# Patient Record
Sex: Female | Born: 1970 | Race: White | Hispanic: No | Marital: Married | Smoking: Never smoker
Health system: Southern US, Community
[De-identification: ages and names within clinical notes are randomized; demographics above are authoritative.]

## PROBLEM LIST (undated history)

## (undated) DIAGNOSIS — K5792 Diverticulitis of intestine, part unspecified, without perforation or abscess without bleeding: Secondary | ICD-10-CM

## (undated) DIAGNOSIS — R971 Elevated cancer antigen 125 [CA 125]: Secondary | ICD-10-CM

## (undated) DIAGNOSIS — Z9889 Other specified postprocedural states: Secondary | ICD-10-CM

## (undated) DIAGNOSIS — F419 Anxiety disorder, unspecified: Secondary | ICD-10-CM

## (undated) DIAGNOSIS — N838 Other noninflammatory disorders of ovary, fallopian tube and broad ligament: Secondary | ICD-10-CM

## (undated) DIAGNOSIS — F32A Depression, unspecified: Secondary | ICD-10-CM

## (undated) DIAGNOSIS — G43909 Migraine, unspecified, not intractable, without status migrainosus: Secondary | ICD-10-CM

## (undated) DIAGNOSIS — R112 Nausea with vomiting, unspecified: Secondary | ICD-10-CM

## (undated) HISTORY — DX: Elevated cancer antigen 125 (CA 125): R97.1

## (undated) HISTORY — PX: OTHER SURGICAL HISTORY: SHX169

## (undated) HISTORY — PX: ABLATION: SHX5711

## (undated) HISTORY — PX: BUNIONECTOMY: SHX129

## (undated) HISTORY — DX: Other noninflammatory disorders of ovary, fallopian tube and broad ligament: N83.8

---

## 2003-10-09 ENCOUNTER — Ambulatory Visit: Payer: Self-pay | Admitting: Psychiatry

## 2003-10-09 ENCOUNTER — Other Ambulatory Visit (HOSPITAL_COMMUNITY): Admission: RE | Admit: 2003-10-09 | Discharge: 2003-10-23 | Payer: Self-pay | Admitting: Psychiatry

## 2003-10-27 ENCOUNTER — Ambulatory Visit (HOSPITAL_COMMUNITY): Payer: Self-pay | Admitting: Professional Counselor

## 2003-11-04 ENCOUNTER — Ambulatory Visit (HOSPITAL_COMMUNITY): Payer: Self-pay | Admitting: Psychiatry

## 2003-11-05 ENCOUNTER — Ambulatory Visit (HOSPITAL_COMMUNITY): Payer: Self-pay | Admitting: Professional Counselor

## 2003-11-06 ENCOUNTER — Other Ambulatory Visit (HOSPITAL_COMMUNITY): Admission: RE | Admit: 2003-11-06 | Discharge: 2003-11-12 | Payer: Self-pay | Admitting: Psychiatry

## 2003-11-06 ENCOUNTER — Ambulatory Visit: Payer: Self-pay | Admitting: Psychiatry

## 2003-11-13 ENCOUNTER — Ambulatory Visit (HOSPITAL_COMMUNITY): Payer: Self-pay | Admitting: Professional Counselor

## 2003-11-17 ENCOUNTER — Ambulatory Visit (HOSPITAL_COMMUNITY): Payer: Self-pay | Admitting: Professional Counselor

## 2003-11-19 ENCOUNTER — Inpatient Hospital Stay (HOSPITAL_COMMUNITY): Admission: RE | Admit: 2003-11-19 | Discharge: 2003-11-25 | Payer: Self-pay | Admitting: Psychiatry

## 2003-11-20 ENCOUNTER — Ambulatory Visit (HOSPITAL_COMMUNITY): Admission: RE | Admit: 2003-11-20 | Discharge: 2003-11-20 | Payer: Self-pay | Admitting: Psychiatry

## 2003-12-02 ENCOUNTER — Ambulatory Visit (HOSPITAL_COMMUNITY): Payer: Self-pay | Admitting: Professional Counselor

## 2003-12-11 ENCOUNTER — Ambulatory Visit (HOSPITAL_COMMUNITY): Payer: Self-pay | Admitting: Psychiatry

## 2007-08-26 ENCOUNTER — Encounter: Admission: RE | Admit: 2007-08-26 | Discharge: 2007-08-26 | Payer: Self-pay | Admitting: Family Medicine

## 2010-07-08 NOTE — Discharge Summary (Signed)
NAMEMAHAGONY, Heather Stephens NO.:  [id number]   MEDICAL RECORD NO.:  [id number]          PATIENT TYPE:  IPS   LOCATION:  0307                          FACILITY:  [facility]   PHYSICIAN:  Geoffery Lyons, M.D.      DATE OF BIRTH:  07/11/1970   DATE OF ADMISSION:  11/19/2003  DATE OF DISCHARGE:  11/25/2003                                 DISCHARGE SUMMARY   CHIEF COMPLAINT AND PRESENT ILLNESS:  This was the first admission to [facility] for this 40 year old white female, married,  voluntarily admitted.  Reports of depression since childhood.  Family  history of depression and suicide.  Admitted feeling totally out of control;  increased spending; compulsive eating; gained 40 pounds; more than three or  four weeks of decreased sleep; racing thoughts; auditory hallucinations;  hearing voices around the house and bloody dreams; feeling unsafe.   PAST PSYCHIATRIC HISTORY:  First time inpatient.  Has seen Dr. Ladona Ridgel and  Dr. Lolly Mustache.  History of depression and mood swings.   ALCOHOL/DRUG HISTORY:  Denies the use or abuse of any substances.   PAST MEDICAL HISTORY:  1.  Migraine headaches.  2.  Restless leg.   MEDICATIONS:  1.  Atenolol 25 mg daily.  2.  Topamax.  3.  Xanax 0.25 mg half to one twice a day as needed.  4.  Abilify 10 mg daily.  5.  Ambien 10 at bedtime for sleep.  6.  Requip 1 mg at night.  7.  Wellbutrin XL 300 mg.  8.  Prozac 20 mg per day, decreased from 60.   PHYSICAL EXAMINATION:  Performed and failed to show any acute findings.   LABORATORY WORKUP:  CBC within normal limits.  Blood chemistries within  normal limits.  Liver profile within normal limits.  TSH 0.984.  Drug  screening negative for substances of abuse.   MENTAL STATUS EXAM:  Reveals a fully alert, calm, focused female.  Cooperative.  Mood anxious, depressed.  Affect anxious.  Speech normal rate,  tempo and production.  Thought processes logical, coherent and relevant.  Some  racing thoughts.  Positive for suicide idea.  No homicide ideas.  No  hallucinations.  Cognition well-preserved.   ADMISSION DIAGNOSES:   AXIS I:  Rule out bipolar disorder, hypomanic.   AXIS II:  No diagnosis.   AXIS III:  Migraine headaches.   AXIS IV:  Moderate.   AXIS V:  Global Assessment of Functioning upon admission 29; highest Global  Assessment of Functioning in the last year 65 to 70.   COURSE IN HOSPITAL:  She was admitted and started in individual and group  psychotherapy.  She was maintained on Prozac 20, Xanax 0.25 half to one  twice a day as needed, Wellbutrin XL 300 mg daily and Ambien 10 at bedtime  for sleep.  Abilify 10 mg daily, atenolol 25 every day.  Abilify was  increased to 15.  She was placed on Topamax 25 twice a day.  Prozac had been  discontinued.  She was started on Lexapro 10 mg per day and Topamax was  placed at 50  twice a day.  She endorsed a long history of depression, but  also of mood swings, racing thoughts, compulsive overspending, overeating,  binging with a question of purging and overspending.  Endorsed that her  behaviors have been present for a long time.  Was on Prozac up to 60.  Has  also been on Zoloft and Paxil unsuccessfully.  That is when she was switched  from Prozac to Lexapro.  She started to tolerate the medication well,  feeling very depressed and unsafe.  Endorsed that she could end up hurting  herself.  Mood was depressed and anxious, worried.  Affect was anxious,  ruminating.  By October the 3rd, she was endorsing voices.  Apparently it  was of her grandmother as well as a group of voices.  Could not tell what  they were saying.  Endorsed that she had become more anxious when hearing  the voices and scared.  There was a session scheduled with her husband.  Abilify was increased to 20 and she had been placed on Seroquel 100 at  bedtime for sleep.  On October the 4th, continued to endorse auditory  hallucinations, more so  __________ in nature.  Endorsed nightmares reacting  to the family session with the husband that she was going to have.  Patient  feeling unsafe outside of the hospital and some passive SI, but willing to  contract for safety.  On October the 4th there was a session with her  husband.  The feeling was that she was not baseline.  She was still  reporting auditory and visual hallucinations, hearing recently deceased  grandmother saying [place] Faster.  On October the 5th she was feeling better.  She was in full contact with reality.  Was endorsing no suicide, no homicide  ideations.  Endorsed decreasing hallucinations.  There was no evidence of  delusions.  Feeling that the session with the husband went well.  He was  supportive, agreeable to family counseling.  She feels stable enough to be  safe.  She does have control.  It was felt that she had obtained full  benefit from what the hospital had to offer, that she was feeling safe and  she was willing to pursue outpatient treatment.  We went ahead and  discharged to outpatient follow-up.   DISCHARGE DIAGNOSES:   AXIS I:  Bipolar disorder with psychotic features.   AXIS II:  No diagnosis.   AXIS III:  Migraine headaches.   AXIS IV:  Moderate.   AXIS V:  Global Assessment of Functioning upon discharge was 55.   DISCHARGE MEDICATIONS:  1.  Wellbutrin XL 300 one in the morning.  2.  Ambien 10 at bedtime as needed for sleep.  3.  Requip 0.5 at night.  4.  Lexapro 10 mg daily.  5.  Topamax 50 twice a day.  6.  Abilify 20 mg daily.  7.  Seroquel 100 at night.  8.  Xanax 0.25 one every eight hours as needed for anxiety.  9.  Seroquel 25 two every six hours as needed for agitation.   FOLLOW UP:  Intensive outpatient program with Dr. Lolly Mustache.     Farrel Gordon   [place]/MEDQ  D:  12/22/2003  T:  12/23/2003  Job:  [id number]

## 2011-08-24 ENCOUNTER — Encounter (HOSPITAL_BASED_OUTPATIENT_CLINIC_OR_DEPARTMENT_OTHER): Payer: Self-pay | Admitting: *Deleted

## 2011-08-24 ENCOUNTER — Emergency Department (HOSPITAL_BASED_OUTPATIENT_CLINIC_OR_DEPARTMENT_OTHER)
Admission: EM | Admit: 2011-08-24 | Discharge: 2011-08-24 | Disposition: A | Discharge status: Home / Self Care | Payer: BC Managed Care – PPO | Attending: Emergency Medicine | Admitting: Emergency Medicine

## 2011-08-24 DIAGNOSIS — R51 Headache: Secondary | ICD-10-CM

## 2011-08-24 HISTORY — DX: Migraine, unspecified, not intractable, without status migrainosus: G43.909

## 2011-08-24 MED ORDER — DIPHENHYDRAMINE HCL 50 MG/ML IJ SOLN
25.0000 mg | Freq: Once | INTRAMUSCULAR | Status: DC
  Filled 2011-08-24: qty 1

## 2011-08-24 MED ORDER — METOCLOPRAMIDE HCL 5 MG/ML IJ SOLN
10.0000 mg | Freq: Once | INTRAMUSCULAR | Status: AC
  Administered 2011-08-24: 10 mg via INTRAMUSCULAR
  Filled 2011-08-24: qty 2

## 2011-08-24 MED ORDER — KETOROLAC TROMETHAMINE 60 MG/2ML IM SOLN
60.0000 mg | Freq: Once | INTRAMUSCULAR | Status: AC
  Administered 2011-08-24: 60 mg via INTRAMUSCULAR
  Filled 2011-08-24: qty 2

## 2011-08-24 MED ORDER — DIPHENHYDRAMINE HCL 50 MG/ML IJ SOLN
25.0000 mg | Freq: Once | INTRAMUSCULAR | Status: AC
  Administered 2011-08-24: 25 mg via INTRAMUSCULAR

## 2011-08-24 NOTE — ED Notes (Signed)
Migraine headache since this afternoon. No relief with Phenergan. Light and sound sensitive.

## 2011-08-24 NOTE — ED Notes (Signed)
Pt. Noted with clear speech and no neuro deficits.  Pt. Reports the migraine is on the L side of her head.

## 2011-08-24 NOTE — ED Provider Notes (Signed)
Medical screening examination/treatment/procedure(s) were performed by non-physician practitioner and as supervising physician I was immediately available for consultation/collaboration.   Forbes Cellar, MD 08/24/11 2351

## 2011-08-24 NOTE — ED Provider Notes (Signed)
History     CSN: [id number]  Arrival date & time 08/24/11  2034   First MD Initiated Contact with Patient 08/24/11 2111      Chief Complaint  Patient presents with  . Migraine    (Consider location/radiation/quality/duration/timing/severity/associated sxs/prior treatment) Patient is a 41 y.o. female presenting with migraine. The history is provided by the patient. No language interpreter was used.  Migraine This is a new problem. The current episode started today. The problem occurs constantly. The problem has been gradually worsening. Associated symptoms include headaches. Exacerbated by: light and sound. Treatments tried: phenergan. The treatment provided no relief.  Pt has a history of migranes.  Pt reports she has not had a shot for migranes in a long time.    Past Medical History  Diagnosis Date  . Migraine headache     History reviewed. No pertinent past surgical history.  No family history on file.  History  Substance Use Topics  . Smoking status: Never Smoker   . Smokeless tobacco: Not on file  . Alcohol Use: No    OB History    Grav Para Term Preterm Abortions TAB SAB Ect Mult Living                  Review of Systems  Neurological: Positive for headaches.  All other systems reviewed and are negative.    Allergies  Review of patient's allergies indicates not on file.  Home Medications  No current outpatient prescriptions on file.  BP 124/88  Pulse 72  Temp 98 F (36.7 C) (Oral)  Resp 16  SpO2 100%  Physical Exam  Nursing note and vitals reviewed. Constitutional: She is oriented to person, place, and time. She appears well-developed.  HENT:  Head: Normocephalic and atraumatic.  Right Ear: External ear normal.  Nose: Nose normal.  Mouth/Throat: Oropharynx is clear and moist.  Eyes: Conjunctivae and EOM are normal. Pupils are equal, round, and reactive to light.  Neck: Normal range of motion. Neck supple.  Cardiovascular: Normal rate and  normal heart sounds.   Pulmonary/Chest: Effort normal and breath sounds normal.  Abdominal: Soft.  Musculoskeletal: Normal range of motion.  Neurological: She is alert and oriented to person, place, and time. She has normal reflexes. No cranial nerve deficit. Coordination normal.  Skin: Skin is warm.  Psychiatric: She has a normal mood and affect.    ED Course  Procedures (including critical care time)  Labs Reviewed - No data to display No results found.   1. Headache       MDM  Pt given torodol, benadryl and reglan.   Pt advised to see her Md for recheck tomorrow if symptoms persist.        Elson Areas, [place] 08/24/11 2125  Lonia Skinner [place], [place] 08/24/11 2126

## 2019-10-13 ENCOUNTER — Emergency Department (HOSPITAL_BASED_OUTPATIENT_CLINIC_OR_DEPARTMENT_OTHER): Payer: Self-pay

## 2019-10-13 ENCOUNTER — Encounter (HOSPITAL_BASED_OUTPATIENT_CLINIC_OR_DEPARTMENT_OTHER): Payer: Self-pay | Admitting: *Deleted

## 2019-10-13 ENCOUNTER — Emergency Department (HOSPITAL_BASED_OUTPATIENT_CLINIC_OR_DEPARTMENT_OTHER)
Admission: EM | Admit: 2019-10-13 | Discharge: 2019-10-13 | Disposition: A | Discharge status: Home / Self Care | Payer: Self-pay | Attending: Emergency Medicine | Admitting: Emergency Medicine

## 2019-10-13 ENCOUNTER — Other Ambulatory Visit: Payer: Self-pay

## 2019-10-13 DIAGNOSIS — R1900 Intra-abdominal and pelvic swelling, mass and lump, unspecified site: Secondary | ICD-10-CM

## 2019-10-13 DIAGNOSIS — Z20822 Contact with and (suspected) exposure to covid-19: Secondary | ICD-10-CM | POA: Insufficient documentation

## 2019-10-13 DIAGNOSIS — R10814 Left lower quadrant abdominal tenderness: Secondary | ICD-10-CM | POA: Insufficient documentation

## 2019-10-13 DIAGNOSIS — K5732 Diverticulitis of large intestine without perforation or abscess without bleeding: Secondary | ICD-10-CM | POA: Insufficient documentation

## 2019-10-13 DIAGNOSIS — R10813 Right lower quadrant abdominal tenderness: Secondary | ICD-10-CM | POA: Insufficient documentation

## 2019-10-13 DIAGNOSIS — R1909 Other intra-abdominal and pelvic swelling, mass and lump: Secondary | ICD-10-CM | POA: Insufficient documentation

## 2019-10-13 DIAGNOSIS — K5792 Diverticulitis of intestine, part unspecified, without perforation or abscess without bleeding: Secondary | ICD-10-CM

## 2019-10-13 LAB — COMPREHENSIVE METABOLIC PANEL
ALT: 62 U/L — ABNORMAL HIGH (ref 0–44)
AST: 38 U/L (ref 15–41)
Albumin: 4 g/dL (ref 3.5–5.0)
Alkaline Phosphatase: 112 U/L (ref 38–126)
Anion gap: 11 (ref 5–15)
BUN: 15 mg/dL (ref 6–20)
CO2: 21 mmol/L — ABNORMAL LOW (ref 22–32)
Calcium: 8.9 mg/dL (ref 8.9–10.3)
Chloride: 104 mmol/L (ref 98–111)
Creatinine, Ser: 0.8 mg/dL (ref 0.44–1.00)
GFR calc Af Amer: >60 mL/min (ref >60)
GFR calc non Af Amer: >60 mL/min (ref >60)
Glucose, Bld: 108 mg/dL — ABNORMAL HIGH (ref 70–99)
Potassium: 3.5 mmol/L (ref 3.5–5.1)
Sodium: 136 mmol/L (ref 135–145)
Total Bilirubin: 1.3 mg/dL — ABNORMAL HIGH (ref 0.3–1.2)
Total Protein: 7.4 g/dL (ref 6.5–8.1)

## 2019-10-13 LAB — CBC
HCT: 39.3 % (ref 36.0–46.0)
Hemoglobin: 13.2 g/dL (ref 12.0–15.0)
MCH: 31.1 pg (ref 26.0–34.0)
MCHC: 33.6 g/dL (ref 30.0–36.0)
MCV: 92.7 fL (ref 80.0–100.0)
Platelets: 295 10*3/uL (ref 150–400)
RBC: 4.24 MIL/uL (ref 3.87–5.11)
RDW: 11.2 % — ABNORMAL LOW (ref 11.5–15.5)
WBC: 12.7 10*3/uL — ABNORMAL HIGH (ref 4.0–10.5)
nRBC: 0 % (ref 0.0–0.2)

## 2019-10-13 LAB — SARS CORONAVIRUS 2 BY RT PCR (HOSPITAL ORDER, PERFORMED IN ~~LOC~~ HOSPITAL LAB): SARS Coronavirus 2: NEGATIVE

## 2019-10-13 LAB — PREGNANCY, URINE: Preg Test, Ur: NEGATIVE

## 2019-10-13 LAB — LIPASE, BLOOD: Lipase: 21 U/L (ref 11–51)

## 2019-10-13 MED ORDER — KETOROLAC TROMETHAMINE 30 MG/ML IJ SOLN
30.0000 mg | Freq: Once | INTRAMUSCULAR | Status: AC
  Administered 2019-10-13: 30 mg via INTRAVENOUS
  Filled 2019-10-13: qty 1

## 2019-10-13 MED ORDER — ONDANSETRON 4 MG PO TBDP: 4.0000 mg | ORAL_TABLET | Freq: Three times a day (TID) | ORAL | 0 refills | Status: DC | PRN

## 2019-10-13 MED ORDER — METRONIDAZOLE 500 MG PO TABS: 500.0000 mg | ORAL_TABLET | Freq: Three times a day (TID) | ORAL | 0 refills | Status: DC

## 2019-10-13 MED ORDER — SODIUM CHLORIDE 0.9 % IV BOLUS
1000.0000 mL | Freq: Once | INTRAVENOUS | Status: AC
  Administered 2019-10-13: 1000 mL via INTRAVENOUS

## 2019-10-13 MED ORDER — ONDANSETRON HCL 4 MG/2ML IJ SOLN
4.0000 mg | Freq: Once | INTRAMUSCULAR | Status: AC
  Administered 2019-10-13: 4 mg via INTRAVENOUS
  Filled 2019-10-13: qty 2

## 2019-10-13 MED ORDER — FENTANYL CITRATE (PF) 100 MCG/2ML IJ SOLN
100.0000 ug | Freq: Once | INTRAMUSCULAR | Status: AC
  Administered 2019-10-13: 100 ug via INTRAVENOUS
  Filled 2019-10-13: qty 2

## 2019-10-13 MED ORDER — DICYCLOMINE HCL 20 MG PO TABS: 20.0000 mg | ORAL_TABLET | Freq: Two times a day (BID) | ORAL | 0 refills | Status: DC | PRN

## 2019-10-13 MED ORDER — CIPROFLOXACIN HCL 500 MG PO TABS: 500.0000 mg | ORAL_TABLET | Freq: Two times a day (BID) | ORAL | 0 refills | Status: DC

## 2019-10-13 MED ORDER — IOHEXOL 300 MG/ML  SOLN
100.0000 mL | Freq: Once | INTRAMUSCULAR | Status: AC | PRN
  Administered 2019-10-13: 100 mL via INTRAVENOUS

## 2019-10-13 MED ORDER — HYDROMORPHONE HCL 1 MG/ML IJ SOLN
0.5000 mg | Freq: Once | INTRAMUSCULAR | Status: AC
  Administered 2019-10-13: 0.5 mg via INTRAVENOUS
  Filled 2019-10-13: qty 1

## 2019-10-13 MED FILL — ONDANSETRON ODT 4 MG TABLET: 4 | 6 days supply | Qty: 20 | Fill #0

## 2019-10-13 MED FILL — METRONIDAZOLE 500 MG TABS: 500 | 10 days supply | Qty: 30 | Fill #0

## 2019-10-13 MED FILL — DICYCLOMINE 20 MG TABLET: 20 | 10 days supply | Qty: 20 | Fill #0

## 2019-10-13 MED FILL — CIPROFLOXACIN HCL 500 MG TA: 500 | 10 days supply | Qty: 20 | Fill #0

## 2019-10-13 NOTE — ED Provider Notes (Signed)
Heather Stephens   CSN: [id number] Arrival date & time: 10/13/19  1056     History Chief Complaint  Patient presents with  . Abdominal Pain    Heather Stephens is a 49 y.o. female.  HPI      49 year old female presents with concern for lower abdominal pain.  Reports that the pain began yesterday, describes as sharp, located bilaterally in her low pelvis.  The pain is worsening today.  Nothing seems to make it better or worse.  Reports associated nausea without vomiting. Reports she had a low-grade fever last night.  No diarrhea. Pain worse with movement. Severe pain. Was seen at [facility] and had urine without infection and instructed to come here for CT. Has hx of uterine ablation, had small amount of bright vaginal bleeding with menses this time-reports usually small amount of brown.  No other changes. Does not have OBGYN  Past Medical History:  Diagnosis Date  . Migraine headache     There are no problems to display for this patient.   Past Surgical History:  Procedure Laterality Date  . ABLATION       OB History   No obstetric history on file.     No family history on file.  Social History   Tobacco Use  . Smoking status: Never Smoker  . Smokeless tobacco: Never Used  Substance Use Topics  . Alcohol use: No  . Drug use: No    Home Medications Prior to Admission medications   Medication Sig Start Date End Date Taking? Authorizing Provider  ciprofloxacin (CIPRO) 500 MG tablet Take 1 tablet (500 mg total) by mouth 2 (two) times daily for 10 days. 10/13/19 10/23/19  Gareth Morgan, MD  dicyclomine (BENTYL) 20 MG tablet Take 1 tablet (20 mg total) by mouth 2 (two) times daily as needed for spasms (abdominal pain). 10/13/19   Gareth Morgan, MD  metroNIDAZOLE (FLAGYL) 500 MG tablet Take 1 tablet (500 mg total) by mouth 3 (three) times daily for 10 days. 10/13/19 10/23/19  Gareth Morgan, MD  ondansetron (ZOFRAN ODT) 4 MG  disintegrating tablet Take 1 tablet (4 mg total) by mouth every 8 (eight) hours as needed for nausea or vomiting. 10/13/19   Gareth Morgan, MD    Allergies    Sumatriptan and Codeine  Review of Systems   Review of Systems  Constitutional: Negative for fever.  HENT: Negative for sore throat.   Eyes: Negative for visual disturbance.  Respiratory: Negative for cough and shortness of breath.   Cardiovascular: Negative for chest pain.  Gastrointestinal: Negative for abdominal pain and nausea.  Genitourinary: Positive for vaginal bleeding (did have small amount of bleeding, hx of uterine ablation, typical menses is small amount of brown discharge, has had small amt of right ). Negative for difficulty urinating, dysuria and vaginal discharge.  Musculoskeletal: Negative for back pain and neck pain.  Skin: Negative for rash.  Neurological: Negative for syncope and headaches.    Physical Exam Updated Vital Signs BP 109/69   Pulse 84   Temp 98.6 F (37 C) (Oral)   Resp 18   Ht 5\' 4"  (1.626 m)   Wt 83 kg   SpO2 98%   BMI 31.41 kg/m   Physical Exam Vitals and nursing Stephens reviewed.  Constitutional:      General: She is not in acute distress.    Appearance: She is well-developed. She is not diaphoretic.  HENT:     Head: Normocephalic and  atraumatic.  Eyes:     Conjunctiva/sclera: Conjunctivae normal.  Cardiovascular:     Rate and Rhythm: Normal rate and regular rhythm.     Heart sounds: Normal heart sounds. No murmur heard.  No friction rub. No gallop.   Pulmonary:     Effort: Pulmonary effort is normal. No respiratory distress.     Breath sounds: Normal breath sounds. No wheezing or rales.  Abdominal:     General: There is no distension.     Palpations: Abdomen is soft.     Tenderness: There is abdominal tenderness in the right lower quadrant, suprapubic area and left lower quadrant. There is guarding.  Musculoskeletal:        General: No tenderness.     Cervical back:  Normal range of motion.  Skin:    General: Skin is warm and dry.     Findings: No erythema or rash.  Neurological:     Mental Status: She is alert and oriented to person, place, and time.     ED Results / Procedures / Treatments   Labs (all labs ordered are listed, but only abnormal results are displayed) Labs Reviewed  COMPREHENSIVE METABOLIC PANEL - Abnormal; Notable for the following components:      Result Value   CO2 21 (*)    Glucose, Bld 108 (*)    ALT 62 (*)    Total Bilirubin 1.3 (*)    All other components within normal limits  CBC - Abnormal; Notable for the following components:   WBC 12.7 (*)    RDW 11.2 (*)    All other components within normal limits  SARS CORONAVIRUS 2 BY RT PCR (HOSPITAL ORDER, [place] [facility])  LIPASE, BLOOD  PREGNANCY, URINE  CA 125  CEA    EKG None  Radiology CT ABDOMEN PELVIS W CONTRAST  Result Date: 10/13/2019 CLINICAL DATA:  49 year old female with history of right lower quadrant abdominal pain. Suspected acute appendicitis. EXAM: CT ABDOMEN AND PELVIS WITH CONTRAST TECHNIQUE: Multidetector CT imaging of the abdomen and pelvis was performed using the standard protocol following bolus administration of intravenous contrast. CONTRAST:  116mL OMNIPAQUE IOHEXOL 300 MG/ML  SOLN COMPARISON:  No priors. FINDINGS: Lower chest: Unremarkable. Hepatobiliary: Subcentimeter low-attenuation lesion in segment 3 of the liver (axial image 14 of series 2), too small to characterize, but statistically likely to represent a tiny cyst. No other suspicious cystic or solid hepatic lesions. No intra or extrahepatic biliary ductal dilatation. Gallbladder is normal in appearance. Pancreas: No pancreatic mass. No pancreatic ductal dilatation. No pancreatic or peripancreatic fluid collections or inflammatory changes. Spleen: Unremarkable. Adrenals/Urinary Tract: Bilateral kidneys and bilateral adrenal glands are normal in appearance. No  hydroureteronephrosis. Urinary bladder is normal in appearance. Stomach/Bowel: Normal appearance of the stomach. No pathologic dilatation of small bowel or colon. Numerous colonic diverticulae are noted, particularly in the descending colon and sigmoid colon. Inflammatory changes are noted adjacent to the distal descending colon (axial image 49 of series 2), compatible with an acute diverticulitis. No diverticular abscess or signs of frank perforation are noted at this time. Normal appendix. Vascular/Lymphatic: No atherosclerotic calcifications in the abdominal aorta or pelvic vasculature. No lymphadenopathy noted in the abdomen or pelvis. 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 Uterus and right ovary are unremarkable in appearance. Other: Small volume of ascites.  No pneumoperitoneum. Musculoskeletal: There are no aggressive appearing lytic or blastic lesions noted in the visualized portions of the skeleton. IMPRESSION: 1. Acute diverticulitis of the distal descending colon. No diverticular abscess or signs of frank perforation are noted at this time. 2. 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 Further evaluation with pelvic ultrasound is strongly recommended at this time to better evaluate this finding. Additionally, appropriate follow-up with OB-GYN is strongly recommended in the near future. 3. Subcentimeter low-attenuation lesion in segment 3 of the liver, too small to characterize, but statistically likely to represent a cyst. Close attention on follow-up studies is recommended to ensure the stability of this  finding given the large pelvic mass. Electronically Signed   By: Vinnie Langton M.D.   On: 10/13/2019 14:24    Procedures Procedures (including critical care time)  Medications Ordered in ED Medications  ketorolac (TORADOL) 30 MG/ML injection 30 mg (has no administration in time range)  sodium chloride 0.9 % bolus 1,000 mL ( Intravenous Stopped 10/13/19 1519)  ondansetron (ZOFRAN) injection 4 mg (4 mg Intravenous Given 10/13/19 1346)  fentaNYL (SUBLIMAZE) injection 100 mcg (100 mcg Intravenous Given 10/13/19 1349)  iohexol (OMNIPAQUE) 300 MG/ML solution 100 mL (100 mLs Intravenous Contrast Given 10/13/19 1357)    ED Course  I have reviewed the triage vital signs and the nursing notes.  Pertinent labs & imaging results that were available during my care of the patient were reviewed by me and considered in my medical decision making (see chart for details).    MDM Rules/Calculators/A&P                           49 year old female presents with concern for lower abdominal pain. DDx includes appendicitis, pancreatitis, cholecystitis, pyelonephritis, nephrolithiasis, diverticulitis, PID, ovarian torsion, ectopic pregnancy, and tuboovarian abscess.  Pregnancy test negative.  No sign of urinary tract infection.   CT abdomen pelvis shows acute diverticulitis of the distal descending colon without diverticular abscess or signs of perforation.  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  For the diverticulitis, she is stable for outpatient management.  Given prescription for Cipro, Flagyl, and Zofran.  She declines narcotic pain medications for pain, was given prescription for Bentyl.  For CT finding of cystic ovarian mass-discussed with OB/GYN Dr. Elgie Congo, added on a CEA 125, CEA, and pelvic ultrasound.  [place] pending at time of transfer of care. She is to  follow-up with med [place].    Final Clinical Impression(s) / ED Diagnoses Final diagnoses:  Pelvic mass  Diverticulitis    Rx / DC Orders ED Discharge Orders         Ordered    ciprofloxacin (CIPRO) 500 MG tablet  2 times daily        10/13/19 1520    metroNIDAZOLE (FLAGYL) 500 MG tablet  3 times daily        10/13/19 1520    dicyclomine (BENTYL) 20 MG tablet  2 times daily PRN        10/13/19 1520    ondansetron (ZOFRAN ODT) 4 MG disintegrating tablet  Every 8 hours PRN  10/13/19 [place], MD 10/13/19 2226

## 2019-10-13 NOTE — ED Notes (Signed)
Pt. Reports abd. Pain started last night getting worse this morning.  Pt. Reports no vomiting and no diarrhea.  Pt. Reports just severe pain in lower abd. Started on the R side of Abd. And now from R to left with any movement the pain is awful.

## 2019-10-13 NOTE — ED Notes (Signed)
Unable to get blood from L a/c. Pt stated she had nothing to eat or drink today and feels queasy. Wants to wait for a 2nd stick at present

## 2019-10-13 NOTE — ED Notes (Signed)
Pt asks for water, informed her no food or drink until she see the provider, asks triage RN also

## 2019-10-13 NOTE — Discharge Instructions (Addendum)
For your diverticulitis, please take the antibiotics as prescribed.  Please follow-up with your primary doctor for recheck later this week.  If you develop fever, worsening pain, vomiting, other new concerning symptom, please return to the ER for reassessment.  Regarding the pelvic mass, please call the [facility] in [facility] to get a close follow-up appointment with a gynecologist.  You will need additional testing.  If you have any issues getting an appointment with their clinic, I would recommend calling Dr. Charisse March office, she is a gynecologic oncologist in [place].

## 2019-10-13 NOTE — ED Triage Notes (Signed)
Mid lower abdominal pain since yesterday. Nausea. Fever. She was seen at [facility] and she does not have a UTI. She was told to come here for a CT scan.

## 2019-10-14 ENCOUNTER — Telehealth: Payer: Self-pay | Admitting: *Deleted

## 2019-10-14 LAB — CEA: CEA: 1.4 ng/mL (ref 0.0–4.7)

## 2019-10-14 LAB — CA 125: Cancer Antigen (CA) 125: 50 U/mL — ABNORMAL HIGH (ref 0.0–38.1)

## 2019-10-14 NOTE — Telephone Encounter (Signed)
Called and spoke with the patient regarding her referral from the ER. Offered the patient a new patient appt for tomorrow, declined appt due to the pain from her diverticulitis. Appt scheduled for 8/30 with Dr Denman George at 11:15 am. Hanley Seamen the address and phone number for the clinic. Also gave the patient the policy for visitors, mask and parking

## 2019-10-15 ENCOUNTER — Other Ambulatory Visit: Payer: Self-pay

## 2019-10-15 ENCOUNTER — Emergency Department (HOSPITAL_BASED_OUTPATIENT_CLINIC_OR_DEPARTMENT_OTHER)
Admission: EM | Admit: 2019-10-15 | Discharge: 2019-10-15 | Disposition: A | Discharge status: Home / Self Care | Payer: Self-pay | Attending: Emergency Medicine | Admitting: Emergency Medicine

## 2019-10-15 ENCOUNTER — Encounter (HOSPITAL_BASED_OUTPATIENT_CLINIC_OR_DEPARTMENT_OTHER): Payer: Self-pay | Admitting: Emergency Medicine

## 2019-10-15 DIAGNOSIS — Z5321 Procedure and treatment not carried out due to patient leaving prior to being seen by health care provider: Secondary | ICD-10-CM | POA: Insufficient documentation

## 2019-10-15 DIAGNOSIS — R1110 Vomiting, unspecified: Secondary | ICD-10-CM | POA: Insufficient documentation

## 2019-10-15 NOTE — ED Triage Notes (Signed)
Pt here with emesis since noon.

## 2019-10-20 ENCOUNTER — Other Ambulatory Visit: Payer: Self-pay

## 2019-10-20 ENCOUNTER — Encounter: Payer: Self-pay | Admitting: Gynecologic Oncology

## 2019-10-20 ENCOUNTER — Inpatient Hospital Stay: Payer: Self-pay | Attending: Gynecologic Oncology | Admitting: Gynecologic Oncology

## 2019-10-20 ENCOUNTER — Other Ambulatory Visit: Payer: Self-pay | Admitting: Gynecologic Oncology

## 2019-10-20 VITALS — BP 105/75 | HR 117 | Temp 99.2°F | Resp 18 | Ht 64.0 in | Wt 178.5 lb

## 2019-10-20 DIAGNOSIS — R1031 Right lower quadrant pain: Secondary | ICD-10-CM | POA: Insufficient documentation

## 2019-10-20 DIAGNOSIS — N920 Excessive and frequent menstruation with regular cycle: Secondary | ICD-10-CM | POA: Insufficient documentation

## 2019-10-20 DIAGNOSIS — N939 Abnormal uterine and vaginal bleeding, unspecified: Secondary | ICD-10-CM

## 2019-10-20 DIAGNOSIS — R1900 Intra-abdominal and pelvic swelling, mass and lump, unspecified site: Secondary | ICD-10-CM

## 2019-10-20 DIAGNOSIS — N898 Other specified noninflammatory disorders of vagina: Secondary | ICD-10-CM | POA: Insufficient documentation

## 2019-10-20 DIAGNOSIS — Z79899 Other long term (current) drug therapy: Secondary | ICD-10-CM | POA: Insufficient documentation

## 2019-10-20 DIAGNOSIS — R971 Elevated cancer antigen 125 [CA 125]: Secondary | ICD-10-CM | POA: Insufficient documentation

## 2019-10-20 NOTE — H&P (View-Only) (Signed)
New Patient Note: Gyn-Onc  Heather Stephens 49 y.o. female  CC:  Chief Complaint  Patient presents with   Pelvic mass    New Patient    Assessment/Plan:  Heather Stephens  is a 4 y.o.  year old with a large pelvic mass, mildly elevated CA 125, possible history of recent diverticulitis, profuse vaginal discharge/cervical discharge, pelvic pain and abnormal uterine bleeding.  I am concerned that she may have a tubal malignancy given the appearance on imaging and her mucoid vaginal discharge. I am recommending robotic assisted total hysterectomy and BSO with staging if malignancy is identified. Alternatively, she may have associated infected ovarian mass from recent diverticulitis.  I explained the risks of the surgery including  bleeding, infection, damage to internal organs (such as bladder,ureters, bowels), blood clot, reoperation and rehospitalization. I explained that minilap is sometimes necessary for specimen delivery. I anticipated a 2-4 weeks postop recovery off of work.   We will follow-up on today's biopsy. If malignant, she will require endometrial staging procedures.   HPI: Heather Stephens is a 81 year old P1 who was seen in consultation at the request of the ER for evaluation of a large pelvic mass, and mildly elevated CA-125.  The patient reported symptoms of profuse mucoid vaginal discharge for several years.  She denied receiving medical care in the preceding 11 years due to lack of health insurance which she attributes to the introduction of the affordable care act.  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  She began experiencing suprapubic and right lower quadrant pain in August 2021 and was seen at [facility] emergency department for this symptom on October 13, 2019.  This prompted a CT of the abdomen and pelvis to be performed which revealed a subcentimeter low-attenuation lesion in  the liver too small to characterize but likely a cyst.  Numerous colonic diverticula noted particular in the descending colon and sigmoid colon with inflammatory changes noted to the adjacent distal descending colon compatible with acute diverticulitis.  No signs of perforation.  No lymphadenopathy.  There is a large heterogeneously enhancing cystic mass in the central pelvis which appeared to emanate from the left adnexa which was presumably a large ovarian mass measured 11.2 x 7.7 x 9.8 cm.  The uterus and right ovary were unremarkable.  There were no ascites or pneumoperitoneum.  A Ca1 25 was drawn on October 13, 2019 it was mildly elevated at 50.  CEA was normal at 1.4.  A transvaginal ultrasound scan was performed to follow-up the CT scan findings, this was performed on the same day.  It showed a large heterogeneous mostly solid-appearing mass within the pelvis measuring up to 12.9 cm.  The mass was in the midline and could not be localized to either ovary due to its size.  Both ovaries were able to be seen as discrete structures with fluid present on the left.  The uterus measured 8.2 x 4.2 x 5.7 cm with a 5 mm endometrium.  She was treated empirically for diverticulitis with ciprofloxacin and Flagyl which was changed to Augmentin when she developed nausea on the Flagyl.  She denied fever and denied an elevated white blood cell count.  She denied symptoms of hematochezia or constipation.  Her medical history is most significant for an absence of health care in the 11 years preceding her consultation with me.  She denied other underlying illness.  She  was not vaccinated against COVID-19 due to concern regarding the safety of the vaccine.  She declined proceeding with vaccination.  Her surgical history is most significant for no prior abdominal surgeries.  Her gynecologic history is remarkable for 1 prior vaginal delivery.  She had no history of abnormal Pap test but her preceding Pap test have been  greater than 10 years prior to her consultation with me.  She denied gynecologic care in the preceding 10 years prior to her first consultation with me.  She reported cyclical brown spotting since undergoing an endometrial ablation in her mid to late 61s for menorrhagia.  Her family cancer history is remarkable for a widely disseminated cancer in her maternal grandfather otherwise no concerning history for ovarian, breast, or endometrial cancers.  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 She lives with her husband.   Current Meds:  Outpatient Encounter Medications as of 10/20/2019  Medication Sig   amoxicillin-clavulanate (AUGMENTIN) 875-125 MG tablet Take 1 tablet by mouth 2 (two) times daily.   promethazine (PHENERGAN) 12.5 MG suppository Place 12.5 mg rectally every 6 (six) hours as needed.   [DISCONTINUED] ciprofloxacin (CIPRO) 500 MG tablet Take 1 tablet (500 mg total) by mouth 2 (two) times daily for 10 days. (Patient not taking: Reported on 10/20/2019)   [DISCONTINUED] dicyclomine (BENTYL) 20 MG tablet Take 1 tablet (20 mg total) by mouth 2 (two) times daily as needed for spasms (abdominal pain). (Patient not taking: Reported on 10/20/2019)   [DISCONTINUED] metroNIDAZOLE (FLAGYL) 500 MG tablet Take 1 tablet (500 mg total) by mouth 3 (three) times daily for 10 days. (Patient not taking: Reported on 10/20/2019)   [DISCONTINUED] ondansetron (ZOFRAN ODT) 4 MG disintegrating tablet Take 1 tablet (4 mg total) by mouth every 8 (eight) hours as needed for nausea or vomiting. (Patient not taking: Reported on 10/20/2019)   No facility-administered encounter medications on file as of 10/20/2019.    Allergy:  Allergies  Allergen Reactions   Sumatriptan Shortness Of Breath   Metronidazole Nausea And Vomiting   Codeine Itching    Social Hx:   Social History   Socioeconomic History   Marital status: Married    Spouse name: Not on file   Number of  children: Not on file   Years of education: Not on file   Highest education level: Not on file  Occupational History   Not on file  Tobacco Use   Smoking status: Never Smoker   Smokeless tobacco: Never Used  Vaping Use   Vaping Use: Never used  Substance and Sexual Activity   Alcohol use: No   Drug use: No   Sexual activity: Not on file  Other Topics Concern   Not on file  Social History Narrative   Not on file   Social Determinants of Health   Financial Resource Strain:    Difficulty of Paying Living Expenses: Not on file  Food Insecurity:    Worried About Running Out of Food in the Last Year: Not on file   Ran Out of Food in the Last Year: Not on file  Transportation Needs:    Lack of Transportation (Medical): Not on file   Lack of Transportation (Non-Medical): Not on file  Physical Activity:    Days of Exercise per Week: Not on file   Minutes of Exercise per Session: Not on file  Stress:    Feeling of Stress : Not on file  Social Connections:  Frequency of Communication with Friends and Family: Not on file   Frequency of Social Gatherings with Friends and Family: Not on file   Attends Religious Services: Not on file   Active Member of Clubs or Organizations: Not on file   Attends Archivist Meetings: Not on file   Marital Status: Not on file  Intimate Partner Violence:    Fear of Current or Ex-Partner: Not on file   Emotionally Abused: Not on file   Physically Abused: Not on file   Sexually Abused: Not on file    Past Surgical Hx:  Past Surgical History:  Procedure Laterality Date   ABLATION     BUNIONECTOMY     right foot    Past Medical Hx:  Past Medical History:  Diagnosis Date   Migraine headache     Past Gynecological History:  Se HPI No LMP recorded. Patient has had an ablation.  Family Hx:  Family History  Problem Relation Age of Onset   Cancer Paternal Grandmother        Cancer all over   Breast cancer Neg Hx    Ovarian  cancer Neg Hx    Endometrial cancer Neg Hx     Review of Systems:  Constitutional  Feels unwell with pain  ENT Normal appearing ears and nares bilaterally Skin/Breast  No rash, sores, jaundice, itching, dryness Cardiovascular  No chest pain, shortness of breath, or edema  Pulmonary  No cough or wheeze.  Gastro Intestinal  No nausea, vomitting, or diarrhoea. No bright red blood per rectum, +abdominal pain, change in bowel movement, or constipation.  Genito Urinary  No frequency, urgency, dysuria,  + mucoid discharge Musculo Skeletal  No myalgia, arthralgia, joint swelling or pain  Neurologic  No weakness, numbness, change in gait,  Psychology  No depression, anxiety, insomnia.   Vitals:  Blood pressure 105/75, pulse (!) 117, temperature 99.2 F (37.3 C), temperature source Tympanic, resp. rate 18, height 5\' 4"  (1.626 m), weight 178 lb 8 oz (81 kg), SpO2 100 %.  Physical Exam: WD in NAD Neck  Supple NROM, without any enlargements.  Lymph Node Survey No cervical supraclavicular or inguinal adenopathy Cardiovascular  Pulse normal rate, regularity and rhythm. S1 and S2 normal.  Lungs  Clear to auscultation bilateraly, without wheezes/crackles/rhonchi. Good air movement.  Skin  No rash/lesions/breakdown  Psychiatry  Alert and oriented to person, place, and time  Abdomen  Normoactive bowel sounds, abdomen soft, non-tender and mildly obese without evidence of hernia.  Back No CVA tenderness Genito Urinary  Vulva/vagina: Normal external female genitalia.  No lesions. No discharge or bleeding.  Bladder/urethra:  No lesions or masses, well supported bladder  Vagina: normal in appearance.  Cervix: Normal appearing, no lesions. Large amount of mucoid material emanating from os.   Uterus:  Bulky,  mobile, no parametrial involvement or nodularity.  Adnexa: 10cm fullness appreciated masses. Rectal  Good tone, no masses no cul de sac nodularity.  Extremities  No bilateral  cyanosis, clubbing or edema.  Procedure Note:  Preop Dx: abnormal uterine discharge Postop Dx: same Procedure: endometrial biopsy Surgeon: Dorann Ou, MD EBL: scant Specimens: endometrium Complications: none Procedure Details: the patient provided verbal consent and verbal timeout was performed. THe speculum was inserted and the cervix visualized and grasped with a tenaculum. The endometrial pipelle was passed twice to a level of 5cm. Small amount of tissue was retrieved. The tenaculum was removed and hemostasis observed. The specimen was sent for pathology   60  minutes of total time was spent for this patient encounter, including preparation, face-to-face counseling with the patient and coordination of care, review of imaging (results and images), communication with the referring provider and documentation of the encounter.   Thereasa Solo, MD  10/20/2019, 11:56 AM

## 2019-10-20 NOTE — Progress Notes (Signed)
New Patient Note: Gyn-Onc  Heather Stephens 49 y.o. female  CC:  Chief Complaint  Patient presents with   Pelvic mass    New Patient    Assessment/Plan:  [name]  is a 65 y.o.  year old with a large pelvic mass, mildly elevated CA 125, possible history of recent diverticulitis, profuse vaginal discharge/cervical discharge, pelvic pain and abnormal uterine bleeding.  I am concerned that she may have a tubal malignancy given the appearance on imaging and her mucoid vaginal discharge. I am recommending robotic assisted total hysterectomy and BSO with staging if malignancy is identified. Alternatively, she may have associated infected ovarian mass from recent diverticulitis.  I explained the risks of the surgery including  bleeding, infection, damage to internal organs (such as bladder,ureters, bowels), blood clot, reoperation and rehospitalization. I explained that minilap is sometimes necessary for specimen delivery. I anticipated a 2-4 weeks postop recovery off of work.   We will follow-up on today's biopsy. If malignant, she will require endometrial staging procedures.   HPI: [name] is a 19 year old P1 who was seen in consultation at the request of the ER for evaluation of a large pelvic mass, and mildly elevated CA-125.  The patient reported symptoms of profuse mucoid vaginal discharge for several years.  She denied receiving medical care in the preceding 11 years due to lack of health insurance which she attributes to the introduction of the affordable care act.  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  She began experiencing suprapubic and right lower quadrant pain in August 2021 and was seen at [facility] emergency department for this symptom on October 13, 2019.  This prompted a CT of the abdomen and pelvis to be performed which revealed a subcentimeter low-attenuation lesion in  the liver too small to characterize but likely a cyst.  Numerous colonic diverticula noted particular in the descending colon and sigmoid colon with inflammatory changes noted to the adjacent distal descending colon compatible with acute diverticulitis.  No signs of perforation.  No lymphadenopathy.  There is a large heterogeneously enhancing cystic mass in the central pelvis which appeared to emanate from the left adnexa which was presumably a large ovarian mass measured 11.2 x 7.7 x 9.8 cm.  The uterus and right ovary were unremarkable.  There were no ascites or pneumoperitoneum.  A Ca1 25 was drawn on October 13, 2019 it was mildly elevated at 50.  CEA was normal at 1.4.  A transvaginal ultrasound scan was performed to follow-up the CT scan findings, this was performed on the same day.  It showed a large heterogeneous mostly solid-appearing mass within the pelvis measuring up to 12.9 cm.  The mass was in the midline and could not be localized to either ovary due to its size.  Both ovaries were able to be seen as discrete structures with fluid present on the left.  The uterus measured 8.2 x 4.2 x 5.7 cm with a 5 mm endometrium.  She was treated empirically for diverticulitis with ciprofloxacin and Flagyl which was changed to Augmentin when she developed nausea on the Flagyl.  She denied fever and denied an elevated white blood cell count.  She denied symptoms of hematochezia or constipation.  Her medical history is most significant for an absence of health care in the 11 years preceding her consultation with me.  She denied other underlying illness.  She  was not vaccinated against COVID-19 due to concern regarding the safety of the vaccine.  She declined proceeding with vaccination.  Her surgical history is most significant for no prior abdominal surgeries.  Her gynecologic history is remarkable for 1 prior vaginal delivery.  She had no history of abnormal Pap test but her preceding Pap test have been  greater than 10 years prior to her consultation with me.  She denied gynecologic care in the preceding 10 years prior to her first consultation with me.  She reported cyclical brown spotting since undergoing an endometrial ablation in her mid to late 56s for menorrhagia.  Her family cancer history is remarkable for a widely disseminated cancer in her maternal grandfather otherwise no concerning history for ovarian, breast, or endometrial cancers.  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 She lives with her husband.   Current Meds:  Outpatient Encounter Medications as of 10/20/2019  Medication Sig   amoxicillin-clavulanate (AUGMENTIN) 875-125 MG tablet Take 1 tablet by mouth 2 (two) times daily.   promethazine (PHENERGAN) 12.5 MG suppository Place 12.5 mg rectally every 6 (six) hours as needed.   [DISCONTINUED] ciprofloxacin (CIPRO) 500 MG tablet Take 1 tablet (500 mg total) by mouth 2 (two) times daily for 10 days. (Patient not taking: Reported on 10/20/2019)   [DISCONTINUED] dicyclomine (BENTYL) 20 MG tablet Take 1 tablet (20 mg total) by mouth 2 (two) times daily as needed for spasms (abdominal pain). (Patient not taking: Reported on 10/20/2019)   [DISCONTINUED] metroNIDAZOLE (FLAGYL) 500 MG tablet Take 1 tablet (500 mg total) by mouth 3 (three) times daily for 10 days. (Patient not taking: Reported on 10/20/2019)   [DISCONTINUED] ondansetron (ZOFRAN ODT) 4 MG disintegrating tablet Take 1 tablet (4 mg total) by mouth every 8 (eight) hours as needed for nausea or vomiting. (Patient not taking: Reported on 10/20/2019)   No facility-administered encounter medications on file as of 10/20/2019.    Allergy:  Allergies  Allergen Reactions   Sumatriptan Shortness Of Breath   Metronidazole Nausea And Vomiting   Codeine Itching    Social Hx:   Social History   Socioeconomic History   Marital status: Married    Spouse name: Not on file   Number of  children: Not on file   Years of education: Not on file   Highest education level: Not on file  Occupational History   Not on file  Tobacco Use   Smoking status: Never Smoker   Smokeless tobacco: Never Used  Vaping Use   Vaping Use: Never used  Substance and Sexual Activity   Alcohol use: No   Drug use: No   Sexual activity: Not on file  Other Topics Concern   Not on file  Social History Narrative   Not on file   Social Determinants of Health   Financial Resource Strain:    Difficulty of Paying Living Expenses: Not on file  Food Insecurity:    Worried About Running Out of Food in the Last Year: Not on file   Ran Out of Food in the Last Year: Not on file  Transportation Needs:    Lack of Transportation (Medical): Not on file   Lack of Transportation (Non-Medical): Not on file  Physical Activity:    Days of Exercise per Week: Not on file   Minutes of Exercise per Session: Not on file  Stress:    Feeling of Stress : Not on file  Social Connections:  Frequency of Communication with Friends and Family: Not on file   Frequency of Social Gatherings with Friends and Family: Not on file   Attends Religious Services: Not on file   Active Member of Clubs or Organizations: Not on file   Attends Archivist Meetings: Not on file   Marital Status: Not on file  Intimate Partner Violence:    Fear of Current or Ex-Partner: Not on file   Emotionally Abused: Not on file   Physically Abused: Not on file   Sexually Abused: Not on file    Past Surgical Hx:  Past Surgical History:  Procedure Laterality Date   ABLATION     BUNIONECTOMY     right foot    Past Medical Hx:  Past Medical History:  Diagnosis Date   Migraine headache     Past Gynecological History:  Se HPI No LMP recorded. Patient has had an ablation.  Family Hx:  Family History  Problem Relation Age of Onset   Cancer Paternal Grandmother        Cancer all over   Breast cancer Neg Hx    Ovarian  cancer Neg Hx    Endometrial cancer Neg Hx     Review of Systems:  Constitutional  Feels unwell with pain  ENT Normal appearing ears and nares bilaterally Skin/Breast  No rash, sores, jaundice, itching, dryness Cardiovascular  No chest pain, shortness of breath, or edema  Pulmonary  No cough or wheeze.  Gastro Intestinal  No nausea, vomitting, or diarrhoea. No bright red blood per rectum, +abdominal pain, change in bowel movement, or constipation.  Genito Urinary  No frequency, urgency, dysuria,  + mucoid discharge Musculo Skeletal  No myalgia, arthralgia, joint swelling or pain  Neurologic  No weakness, numbness, change in gait,  Psychology  No depression, anxiety, insomnia.   Vitals:  Blood pressure 105/75, pulse (!) 117, temperature 99.2 F (37.3 C), temperature source Tympanic, resp. rate 18, height 5\' 4"  (1.626 m), weight 178 lb 8 oz (81 kg), SpO2 100 %.  Physical Exam: WD in NAD Neck  Supple NROM, without any enlargements.  Lymph Node Survey No cervical supraclavicular or inguinal adenopathy Cardiovascular  Pulse normal rate, regularity and rhythm. S1 and S2 normal.  Lungs  Clear to auscultation bilateraly, without wheezes/crackles/rhonchi. Good air movement.  Skin  No rash/lesions/breakdown  Psychiatry  Alert and oriented to person, place, and time  Abdomen  Normoactive bowel sounds, abdomen soft, non-tender and mildly obese without evidence of hernia.  Back No CVA tenderness Genito Urinary  Vulva/vagina: Normal external female genitalia.  No lesions. No discharge or bleeding.  Bladder/urethra:  No lesions or masses, well supported bladder  Vagina: normal in appearance.  Cervix: Normal appearing, no lesions. Large amount of mucoid material emanating from os.   Uterus:  Bulky,  mobile, no parametrial involvement or nodularity.  Adnexa: 10cm fullness appreciated masses. Rectal  Good tone, no masses no cul de sac nodularity.  Extremities  No bilateral  cyanosis, clubbing or edema.  Procedure Note:  Preop Dx: abnormal uterine discharge Postop Dx: same Procedure: endometrial biopsy Surgeon: Dorann Ou, MD EBL: scant Specimens: endometrium Complications: none Procedure Details: the patient provided verbal consent and verbal timeout was performed. THe speculum was inserted and the cervix visualized and grasped with a tenaculum. The endometrial pipelle was passed twice to a level of 5cm. Small amount of tissue was retrieved. The tenaculum was removed and hemostasis observed. The specimen was sent for pathology   60  minutes of total time was spent for this patient encounter, including preparation, face-to-face counseling with the patient and coordination of care, review of imaging (results and images), communication with the referring provider and documentation of the encounter.   Thereasa Solo, MD  10/20/2019, 11:56 AM

## 2019-10-20 NOTE — Patient Instructions (Addendum)
We will contact you with the results of your endometrial biopsy from today.  Preparing for your Surgery  Plan for surgery on October 28, 2019 with Dr. Everitt Amber at [facility] will be scheduled for a robotic assisted total laparoscopic hysterectomy (removal of uterus/cervix), unilateral salpingo-oophorectomy (removal of one ovary and fallopian tube), possible bilateral salpingo-oophorectomy (removal of both ovaries and fallopian tubes), possible staging if cancer identified, possible mini-laparotomy (bigger incision on your abdomen if needed).   Pre-operative Testing -You will receive a phone call from presurgical testing at [facility] to arrange for a pre-operative appointment over the phone, lab appointment, and COVID test. The COVID test normally happens 3 days prior to the surgery and they ask that you self quarantine after the test up until surgery to decrease chance of exposure.  -Bring your insurance card, copy of an advanced directive if applicable, medication list  -At that visit, you will be asked to sign a consent for a possible blood transfusion in case a transfusion becomes necessary during surgery.  The need for a blood transfusion is rare but having consent is a necessary part of your care.     -You should not be taking blood thinners or aspirin at least ten days prior to surgery unless instructed by your surgeon.  -Do not take supplements such as fish oil (omega 3), red yeast rice, turmeric before your surgery.   Day Before Surgery at [facility] will be asked to take in a light diet the day before surgery. You will be advised you can have clear liquids up until 3 hours before your surgery.    Eat a light diet the day before surgery.  Examples including soups, broths, toast, yogurt, mashed potatoes.  AVOID GAS PRODUCING FOODS. Things to avoid include carbonated beverages (fizzy beverages), raw fruits and raw vegetables, or beans.   If your bowels are  filled with gas, your surgeon will have difficulty visualizing your pelvic organs which increases your surgical risks.  Your role in recovery Your role is to become active as soon as directed by your doctor, while still giving yourself time to heal.  Rest when you feel tired. You will be asked to do the following in order to speed your recovery:  - Cough and breathe deeply. This helps to clear and expand your lungs and can prevent pneumonia after surgery.  - [place]. Do mild physical activity. Walking or moving your legs help your circulation and body functions return to normal. Do not try to get up or walk alone the first time after surgery.   -If you develop swelling on one leg or the other, pain in the back of your leg, redness/warmth in one of your legs, please call the office or go to the Emergency Room to have a doppler to rule out a blood clot. For shortness of breath, chest pain-seek care in the Emergency Room as soon as possible. - Actively manage your pain. Managing your pain lets you move in comfort. We will ask you to rate your pain on a scale of zero to 10. It is your responsibility to tell your doctor or nurse where and how much you hurt so your pain can be treated.  Special Considerations -If you are diabetic, you may be placed on insulin after surgery to have closer control over your blood sugars to promote healing and recovery.  This does not mean that you will be discharged on insulin.  If  applicable, your oral antidiabetics will be resumed when you are tolerating a solid diet.  -Your final pathology results from surgery should be available around one week after surgery and the results will be relayed to you when available.  -Dr. Lahoma Crocker is the surgeon that assists your GYN Oncologist with surgery.  If you end up staying the night, the next day after your surgery you will either see Dr. Denman George, Dr. Berline Lopes, or Dr. Lahoma Crocker.  -FMLA forms can  be faxed to [phone] and please allow 5-7 business days for completion.  Pain Management After Surgery -Make sure that you have Tylenol and Ibuprofen at home to use on a regular basis after surgery for pain control. We recommend alternating the medications every hour to six hours since they work differently and are processed in the body differently for pain relief.  Bowel Regimen -You will be prescribed Sennakot-S after surgery to take nightly to prevent constipation especially if you are taking the narcotic pain medication intermittently.  It is important to prevent constipation and drink adequate amounts of liquids. You can stop taking this medication when you are not taking pain medication and you are back on your normal bowel routine.  Risks of Surgery Risks of surgery are low but include bleeding, infection, damage to surrounding structures, re-operation, blood clots, and very rarely death.   Blood Transfusion Information (For the consent to be signed before surgery)  We will be checking your blood type before surgery so in case of emergencies, we will know what type of blood you would need.                                            WHAT IS A BLOOD TRANSFUSION?  A transfusion is the replacement of blood or some of its parts. Blood is made up of multiple cells which provide different functions.  Red blood cells carry oxygen and are used for blood loss replacement.  White blood cells fight against infection.  Platelets control bleeding.  Plasma helps clot blood.  Other blood products are available for specialized needs, such as hemophilia or other clotting disorders. BEFORE THE TRANSFUSION  Who gives blood for transfusions?   You may be able to donate blood to be used at a later date on yourself (autologous donation).  Relatives can be asked to donate blood. This is generally not any safer than if you have received blood from a stranger. The same precautions are taken to  ensure safety when a relative's blood is donated.  Healthy volunteers who are fully evaluated to make sure their blood is safe. This is blood bank blood. Transfusion therapy is the safest it has ever been in the practice of medicine. Before blood is taken from a donor, a complete history is taken to make sure that person has no history of diseases nor engages in risky social behavior (examples are intravenous drug use or sexual activity with multiple partners). The donor's travel history is screened to minimize risk of transmitting infections, such as malaria. The donated blood is tested for signs of infectious diseases, such as HIV and hepatitis. The blood is then tested to be sure it is compatible with you in order to minimize the chance of a transfusion reaction. If you or a relative donates blood, this is often done in anticipation of surgery and is not appropriate for emergency situations.  It takes many days to process the donated blood. RISKS AND COMPLICATIONS Although transfusion therapy is very safe and saves many lives, the main dangers of transfusion include:   Getting an infectious disease.  Developing a transfusion reaction. This is an allergic reaction to something in the blood you were given. Every precaution is taken to prevent this. The decision to have a blood transfusion has been considered carefully by your caregiver before blood is given. Blood is not given unless the benefits outweigh the risks.  AFTER SURGERY INSTRUCTIONS  Return to work: 4-6 weeks if applicable  Activity: 1. Be up and out of the bed during the day.  Take a nap if needed.  You may walk up steps but be careful and use the hand rail.  Stair climbing will tire you more than you think, you may need to stop part way and rest.   2. No lifting or straining for 6 weeks over 10 pounds. No pushing, pulling, straining for 6 weeks.  3. No driving for 1 week(s).  Do not drive if you are taking narcotic pain medicine and  make sure that your reaction time has returned.   4. You can shower as soon as the next day after surgery. Shower daily.  Use your regular soap and water (not directly on the incision) and pat your incision(s) dry afterwards; don't rub.  No tub baths or submerging your body in water until cleared by your surgeon. If you have the soap that was given to you by pre-surgical testing that was used before surgery, you do not need to use it afterwards because this can irritate your incisions.   5. No sexual activity and nothing in the vagina for 8 weeks.  6. You may experience a small amount of clear drainage from your incisions, which is normal.  If the drainage persists, increases, or changes color please call the office.  7. Do not use creams, lotions, or ointments such as neosporin on your incisions after surgery until advised by your surgeon because they can cause removal of the dermabond glue on your incisions.    8. You may experience vaginal spotting after surgery or around the 6-8 week mark from surgery when the stitches at the top of the vagina begin to dissolve.  The spotting is normal but if you experience heavy bleeding, call our office.  9. Take Tylenol or ibuprofen first for pain and only use narcotic pain medication for severe pain not relieved by the Tylenol or Ibuprofen.  Monitor your Tylenol intake to a max of 4,000 mg in a 24 hour period. You can alternate these medications after surgery.  Diet: 1. Low sodium Heart Healthy Diet is recommended but you are cleared to resume your normal (before surgery) diet after your procedure.  2. It is safe to use a laxative, such as Miralax or Colace, if you have difficulty moving your bowels. You have been prescribed Sennakot at bedtime every evening to keep bowel movements regular and to prevent constipation.    Wound Care: 1. Keep clean and dry.  Shower daily.  Reasons to call the Doctor:  Fever - Oral temperature greater than 100.4 degrees  Fahrenheit  Foul-smelling vaginal discharge  Difficulty urinating  Nausea and vomiting  Increased pain at the site of the incision that is unrelieved with pain medicine.  Difficulty breathing with or without chest pain  New calf pain especially if only on one side  Sudden, continuing increased vaginal bleeding with or without clots.  Contacts: For questions or concerns you should contact:  Dr. Everitt Amber at [phone]  Joylene John, NP at [phone]  After Hours: call [phone] and have the GYN Oncologist paged/contacted  Endometrial Biopsy, Care After  This sheet gives you information about how to care for yourself after your procedure. Your health care provider may also give you more specific instructions. If you have problems or questions, contact your health care provider. What can I expect after the procedure? After the procedure, it is common to have:  Mild cramping.  A small amount of vaginal bleeding for a few days. This is normal. Follow these instructions at home:   Take over-the-counter and prescription medicines only as told by your health care provider.  Do not douche, use tampons, or have sexual intercourse until your health care provider approves.  Return to your normal activities as told by your health care provider. Ask your health care provider what activities are safe for you.  Follow instructions from your health care provider about any activity restrictions, such as restrictions on strenuous exercise or heavy lifting. Contact a health care provider if:  You have heavy bleeding, or bleed for longer than 2 days after the procedure.  You have bad smelling discharge from your vagina.  You have a fever or chills.  You have a burning sensation when urinating or you have difficulty urinating.  You have severe pain in your lower abdomen. Get help right away if:  You have severe cramps in your stomach or back.  You pass large blood  clots.  Your bleeding increases.  You become weak or light-headed, or you pass out. Summary  After the procedure, it is common to have mild cramping and a small amount of vaginal bleeding for a few days.  Do not douche, use tampons, or have sexual intercourse until your health care provider approves.  Return to your normal activities as told by your health care provider. Ask your health care provider what activities are safe for you. This information is not intended to replace advice given to you by your health care provider. Make sure you discuss any questions you have with your health care provider. Document Revised: 01/19/2017 Document Reviewed: 02/23/2016 Elsevier Patient Education  [place].

## 2019-10-22 LAB — SURGICAL PATHOLOGY

## 2019-10-22 NOTE — Progress Notes (Addendum)
DUE TO COVID-19 ONLY ONE VISITOR IS ALLOWED TO COME WITH YOU AND STAY IN THE WAITING ROOM ONLY DURING PRE OP AND PROCEDURE DAY OF SURGERY. THE 1 VISITOR  MAY VISIT WITH YOU AFTER SURGERY IN YOUR PRIVATE ROOM DURING VISITING HOURS ONLY!  YOU NEED TO HAVE A COVID 19 TEST ON__9/04/2019 _____ @_______ , THIS TEST MUST BE DONE BEFORE SURGERY,  COVID TESTING SITE [address] [place] [address], IT IS ON THE RIGHT GOING OUT [address] APPROXIMATELY  2 MINUTES PAST [employer] ON THE RIGHT. ONCE YOUR COVID TEST IS COMPLETED,  PLEASE BEGIN THE QUARANTINE INSTRUCTIONS AS OUTLINED IN YOUR HANDOUT.                CAMYRA VAETH  10/22/2019   Your procedure is scheduled on: 10/28/2019    Report to [facility] Main  Entrance   Report to admitting at       1045 AM     Call this number if you have problems the morning of surgery [phone]              Eat a light diet the day before surgery.  Avoid gas producing foods.    Remember: Do not eat food , candy gum or mints :After Midnight. You may have clear liquids from midnight until    0945am     CLEAR LIQUID DIET   Foods Allowed                                                                       Coffee and tea, regular and decaf                              Plain Jell-O any favor except red or purple                                            Fruit ices (not with fruit pulp)                                      Iced Popsicles                                     Carbonated beverages, regular and diet                                    Cranberry, grape and apple juices Sports drinks like Gatorade Lightly seasoned clear broth or consume(fat free) Sugar, honey syrup   _____________________________________________________________________    BRUSH YOUR TEETH MORNING OF SURGERY AND RINSE YOUR MOUTH OUT, NO CHEWING GUM CANDY OR MINTS.     Take these medicines the morning of surgery with A SIP OF WATER: none  You may not have any metal on your body including hair pins and              piercings  Do not wear jewelry, make-up, lotions, powders or perfumes, deodorant             Do not wear nail polish on your fingernails.  Do not shave  48 hours prior to surgery.              Men may shave face and neck.   Do not bring valuables to the hospital. [place].  Contacts, dentures or bridgework may not be worn into surgery.  Leave suitcase in the car. After surgery it may be brought to your room.     Patients discharged the day of surgery will not be allowed to drive home. IF YOU ARE HAVING SURGERY AND GOING HOME THE SAME DAY, YOU MUST HAVE AN ADULT TO DRIVE YOU HOME AND BE WITH YOU FOR 24 HOURS. YOU MAY GO HOME BY TAXI OR UBER OR ORTHERWISE, BUT AN ADULT MUST ACCOMPANY YOU HOME AND STAY WITH YOU FOR 24 HOURS.  Name and phone number of your driver:  Special Instructions: N/A              Please read over the following fact sheets you were given: _____________________________________________________________________  [facility] - Preparing for Surgery Before surgery, you can play an important role.  Because skin is not sterile, your skin needs to be as free of germs as possible.  You can reduce the number of germs on your skin by washing with CHG (chlorahexidine gluconate) soap before surgery.  CHG is an antiseptic cleaner which kills germs and bonds with the skin to continue killing germs even after washing. Please DO NOT use if you have an allergy to CHG or antibacterial soaps.  If your skin becomes reddened/irritated stop using the CHG and inform your nurse when you arrive at Short Stay. Do not shave (including legs and underarms) for at least 48 hours prior to the first CHG shower.  You may shave your face/neck. Please follow these instructions carefully:  1.  Shower with CHG Soap the night before surgery and the   morning of Surgery.  2.  If you choose to wash your hair, wash your hair first as usual with your  normal  shampoo.  3.  After you shampoo, rinse your hair and body thoroughly to remove the  shampoo.                           4.  Use CHG as you would any other liquid soap.  You can apply chg directly  to the skin and wash                       Gently with a scrungie or clean washcloth.  5.  Apply the CHG Soap to your body ONLY FROM THE NECK DOWN.   Do not use on face/ open                           Wound or open sores. Avoid contact with eyes, ears mouth and genitals (private parts).  Wash face,  Genitals (private parts) with your normal soap.             6.  Wash thoroughly, paying special attention to the area where your surgery  will be performed.  7.  Thoroughly rinse your body with warm water from the neck down.  8.  DO NOT shower/wash with your normal soap after using and rinsing off  the CHG Soap.                9.  Pat yourself dry with a clean towel.            10.  Wear clean pajamas.            11.  Place clean sheets on your bed the night of your first shower and do not  sleep with pets. Day of Surgery : Do not apply any lotions/deodorants the morning of surgery.  Please wear clean clothes to the hospital/surgery center.  FAILURE TO FOLLOW THESE INSTRUCTIONS MAY RESULT IN THE CANCELLATION OF YOUR SURGERY PATIENT SIGNATURE_________________________________  NURSE SIGNATURE__________________________________  ________________________________________________________________________

## 2019-10-23 ENCOUNTER — Encounter (HOSPITAL_COMMUNITY): Payer: Self-pay

## 2019-10-23 ENCOUNTER — Encounter (HOSPITAL_COMMUNITY)
Admission: RE | Admit: 2019-10-23 | Discharge: 2019-10-23 | Disposition: A | Discharge status: Home / Self Care | Payer: Self-pay | Source: Ambulatory Visit | Attending: Gynecologic Oncology | Admitting: Gynecologic Oncology

## 2019-10-23 ENCOUNTER — Other Ambulatory Visit: Payer: Self-pay

## 2019-10-23 ENCOUNTER — Encounter: Payer: Self-pay | Admitting: Gynecologic Oncology

## 2019-10-23 DIAGNOSIS — Z01812 Encounter for preprocedural laboratory examination: Secondary | ICD-10-CM | POA: Insufficient documentation

## 2019-10-23 HISTORY — DX: Depression, unspecified: F32.A

## 2019-10-23 HISTORY — DX: Diverticulitis of intestine, part unspecified, without perforation or abscess without bleeding: K57.92

## 2019-10-23 HISTORY — DX: Anxiety disorder, unspecified: F41.9

## 2019-10-23 HISTORY — DX: Nausea with vomiting, unspecified: R11.2

## 2019-10-23 HISTORY — DX: Other specified postprocedural states: Z98.890

## 2019-10-23 LAB — URINALYSIS, ROUTINE W REFLEX MICROSCOPIC
Bacteria, UA: NONE SEEN
Bilirubin Urine: NEGATIVE
Glucose, UA: NEGATIVE mg/dL
Ketones, ur: 5 mg/dL — AB
Nitrite: NEGATIVE
Protein, ur: 30 mg/dL — AB
Specific Gravity, Urine: 1.017 (ref 1.005–1.030)
pH: 7 (ref 5.0–8.0)

## 2019-10-23 LAB — COMPREHENSIVE METABOLIC PANEL
ALT: 38 U/L (ref 0–44)
AST: 36 U/L (ref 15–41)
Albumin: 3.6 g/dL (ref 3.5–5.0)
Alkaline Phosphatase: 97 U/L (ref 38–126)
Anion gap: 12 (ref 5–15)
BUN: 11 mg/dL (ref 6–20)
CO2: 29 mmol/L (ref 22–32)
Calcium: 8.9 mg/dL (ref 8.9–10.3)
Chloride: 98 mmol/L (ref 98–111)
Creatinine, Ser: 0.78 mg/dL (ref 0.44–1.00)
GFR calc Af Amer: >60 mL/min (ref >60)
GFR calc non Af Amer: >60 mL/min (ref >60)
Glucose, Bld: 103 mg/dL — ABNORMAL HIGH (ref 70–99)
Potassium: 4.5 mmol/L (ref 3.5–5.1)
Sodium: 139 mmol/L (ref 135–145)
Total Bilirubin: 0.6 mg/dL (ref 0.3–1.2)
Total Protein: 7.4 g/dL (ref 6.5–8.1)

## 2019-10-23 LAB — CBC
HCT: 36.7 % (ref 36.0–46.0)
Hemoglobin: 12.3 g/dL (ref 12.0–15.0)
MCH: 31.4 pg (ref 26.0–34.0)
MCHC: 33.5 g/dL (ref 30.0–36.0)
MCV: 93.6 fL (ref 80.0–100.0)
Platelets: 539 10*3/uL — ABNORMAL HIGH (ref 150–400)
RBC: 3.92 MIL/uL (ref 3.87–5.11)
RDW: 11.2 % — ABNORMAL LOW (ref 11.5–15.5)
WBC: 16.5 10*3/uL — ABNORMAL HIGH (ref 4.0–10.5)
nRBC: 0 % (ref 0.0–0.2)

## 2019-10-23 NOTE — Progress Notes (Addendum)
7Anesthesia Review:  PCP: Was Dr Ruben Gottron, Now followed by  Dr Dineen Kid  Seen on 10/17/19 by Dr Dineen Kid with [place]. - note in epic.  Seen by [facility] Lederer on 10/17/19 for diverticulitis.  _ note in epic.  Was in ED on 10/13/19 for Diverticulitis Cardiologist : Chest x-ray : EKG : Echo : Stress test: Cardiac Cath :  Activity level: can do ao flight of stairs without difficulty  Sleep Study/ CPAP :no Fasting Blood Sugar :      / Checks Blood Sugar -- times a day:  no Blood Thinner/ Instructions /Last Dose: ASA / Instructions/ Last Dose :  CBC- white count- 16.7 recently 12.4 on labs routed to DR Denman George and [place] is aware on 10/23/19 along iwht u/a results.

## 2019-10-24 ENCOUNTER — Telehealth: Payer: Self-pay

## 2019-10-24 ENCOUNTER — Other Ambulatory Visit (HOSPITAL_COMMUNITY)
Admission: RE | Admit: 2019-10-24 | Discharge: 2019-10-24 | Disposition: A | Discharge status: Home / Self Care | Payer: HRSA Program | Source: Ambulatory Visit | Attending: Gynecologic Oncology | Admitting: Gynecologic Oncology

## 2019-10-24 DIAGNOSIS — Z01818 Encounter for other preprocedural examination: Secondary | ICD-10-CM | POA: Diagnosis present

## 2019-10-24 DIAGNOSIS — Z20822 Contact with and (suspected) exposure to covid-19: Secondary | ICD-10-CM | POA: Diagnosis not present

## 2019-10-24 LAB — SARS CORONAVIRUS 2 (TAT 6-24 HRS): SARS Coronavirus 2: NEGATIVE

## 2019-10-24 NOTE — Telephone Encounter (Signed)
Spoke with patient to inform that endometrial Bx was benign, no cancer.  Patient concerned about only one ovary being removed vs both.  She would prefer to have both removed.  Nurse explained that this could put her into menopause and may have to take estrogen therapy.  NP with speak with Dr. Denman George about above and call patient back.  Patient has increasing white count.  She reports she is still taking Augmentin and has no appetite.  Developed a fever a few nights ago but denies fever in the past 48 hours.  Diverticulitis symptoms have improved but she still has diarrhea at night and her abd is sore.  She denies any urinary symptoms but has some itchiness.  She reports she has been using wipes in the area which may be causing some dryness.

## 2019-10-28 ENCOUNTER — Other Ambulatory Visit: Payer: Self-pay

## 2019-10-28 ENCOUNTER — Encounter (HOSPITAL_COMMUNITY): Payer: Self-pay | Admitting: Gynecologic Oncology

## 2019-10-28 ENCOUNTER — Ambulatory Visit (HOSPITAL_COMMUNITY)
Admission: RE | Admit: 2019-10-28 | Discharge: 2019-10-28 | Disposition: A | Discharge status: Home / Self Care | Payer: Self-pay | Attending: Gynecologic Oncology | Admitting: Gynecologic Oncology

## 2019-10-28 ENCOUNTER — Ambulatory Visit (HOSPITAL_COMMUNITY): Payer: Self-pay | Admitting: Certified Registered"

## 2019-10-28 ENCOUNTER — Telehealth: Payer: Self-pay | Admitting: *Deleted

## 2019-10-28 ENCOUNTER — Encounter (HOSPITAL_COMMUNITY)
Admission: RE | Disposition: A | Discharge status: Home / Self Care | Payer: Self-pay | Source: Home / Self Care | Attending: Gynecologic Oncology

## 2019-10-28 ENCOUNTER — Ambulatory Visit (HOSPITAL_COMMUNITY): Payer: Self-pay | Admitting: Physician Assistant

## 2019-10-28 DIAGNOSIS — D259 Leiomyoma of uterus, unspecified: Secondary | ICD-10-CM | POA: Insufficient documentation

## 2019-10-28 DIAGNOSIS — N839 Noninflammatory disorder of ovary, fallopian tube and broad ligament, unspecified: Secondary | ICD-10-CM | POA: Insufficient documentation

## 2019-10-28 DIAGNOSIS — Z888 Allergy status to other drugs, medicaments and biological substances status: Secondary | ICD-10-CM | POA: Insufficient documentation

## 2019-10-28 DIAGNOSIS — D250 Submucous leiomyoma of uterus: Secondary | ICD-10-CM

## 2019-10-28 DIAGNOSIS — F329 Major depressive disorder, single episode, unspecified: Secondary | ICD-10-CM | POA: Insufficient documentation

## 2019-10-28 DIAGNOSIS — K660 Peritoneal adhesions (postprocedural) (postinfection): Secondary | ICD-10-CM

## 2019-10-28 DIAGNOSIS — N920 Excessive and frequent menstruation with regular cycle: Secondary | ICD-10-CM | POA: Insufficient documentation

## 2019-10-28 DIAGNOSIS — E669 Obesity, unspecified: Secondary | ICD-10-CM | POA: Insufficient documentation

## 2019-10-28 DIAGNOSIS — R1900 Intra-abdominal and pelvic swelling, mass and lump, unspecified site: Secondary | ICD-10-CM | POA: Diagnosis present

## 2019-10-28 DIAGNOSIS — N8353 Torsion of ovary, ovarian pedicle and fallopian tube: Secondary | ICD-10-CM | POA: Insufficient documentation

## 2019-10-28 DIAGNOSIS — D271 Benign neoplasm of left ovary: Secondary | ICD-10-CM | POA: Insufficient documentation

## 2019-10-28 DIAGNOSIS — Z79899 Other long term (current) drug therapy: Secondary | ICD-10-CM | POA: Insufficient documentation

## 2019-10-28 DIAGNOSIS — R971 Elevated cancer antigen 125 [CA 125]: Secondary | ICD-10-CM | POA: Diagnosis present

## 2019-10-28 DIAGNOSIS — N83202 Unspecified ovarian cyst, left side: Secondary | ICD-10-CM

## 2019-10-28 DIAGNOSIS — Z885 Allergy status to narcotic agent status: Secondary | ICD-10-CM | POA: Insufficient documentation

## 2019-10-28 DIAGNOSIS — Z6830 Body mass index (BMI) 30.0-30.9, adult: Secondary | ICD-10-CM | POA: Insufficient documentation

## 2019-10-28 DIAGNOSIS — Z809 Family history of malignant neoplasm, unspecified: Secondary | ICD-10-CM | POA: Insufficient documentation

## 2019-10-28 DIAGNOSIS — N83292 Other ovarian cyst, left side: Secondary | ICD-10-CM | POA: Insufficient documentation

## 2019-10-28 DIAGNOSIS — G43909 Migraine, unspecified, not intractable, without status migrainosus: Secondary | ICD-10-CM | POA: Insufficient documentation

## 2019-10-28 DIAGNOSIS — F419 Anxiety disorder, unspecified: Secondary | ICD-10-CM | POA: Insufficient documentation

## 2019-10-28 DIAGNOSIS — N800 Endometriosis of uterus: Secondary | ICD-10-CM | POA: Insufficient documentation

## 2019-10-28 DIAGNOSIS — N898 Other specified noninflammatory disorders of vagina: Secondary | ICD-10-CM | POA: Insufficient documentation

## 2019-10-28 HISTORY — PX: ROBOTIC ASSISTED TOTAL HYSTERECTOMY WITH BILATERAL SALPINGO OOPHERECTOMY: SHX6086

## 2019-10-28 LAB — PREGNANCY, URINE: Preg Test, Ur: NEGATIVE

## 2019-10-28 LAB — TYPE AND SCREEN
ABO/RH(D): A POS
Antibody Screen: NEGATIVE

## 2019-10-28 LAB — ABO/RH: ABO/RH(D): A POS

## 2019-10-28 SURGERY — HYSTERECTOMY, TOTAL, ROBOT-ASSISTED, LAPAROSCOPIC, WITH BILATERAL SALPINGO-OOPHORECTOMY
Anesthesia: General

## 2019-10-28 MED ORDER — KETAMINE HCL 10 MG/ML IJ SOLN
INTRAMUSCULAR | Status: DC | PRN
  Administered 2019-10-28: 20 mg via INTRAVENOUS

## 2019-10-28 MED ORDER — FLUCONAZOLE 150 MG PO TABS: 150.0000 mg | ORAL_TABLET | Freq: Every day | ORAL | 0 refills | Status: AC

## 2019-10-28 MED ORDER — MIDAZOLAM HCL 2 MG/2ML IJ SOLN
INTRAMUSCULAR | Status: AC
  Filled 2019-10-28: qty 2

## 2019-10-28 MED ORDER — KETAMINE HCL 10 MG/ML IJ SOLN
INTRAMUSCULAR | Status: AC
  Filled 2019-10-28: qty 1

## 2019-10-28 MED ORDER — SUGAMMADEX SODIUM 200 MG/2ML IV SOLN
INTRAVENOUS | Status: DC | PRN
  Administered 2019-10-28: 4 mg via INTRAVENOUS
  Administered 2019-10-28: 170 mg via INTRAVENOUS

## 2019-10-28 MED ORDER — FENTANYL CITRATE (PF) 250 MCG/5ML IJ SOLN
INTRAMUSCULAR | Status: DC | PRN
Start: 2019-10-28 — End: 2019-10-28
  Administered 2019-10-28: 50 ug via INTRAVENOUS
  Administered 2019-10-28: 100 ug via INTRAVENOUS
  Administered 2019-10-28: 50 ug via INTRAVENOUS

## 2019-10-28 MED ORDER — PHENYLEPHRINE 40 MCG/ML (10ML) SYRINGE FOR IV PUSH (FOR BLOOD PRESSURE SUPPORT)
PREFILLED_SYRINGE | INTRAVENOUS | Status: DC | PRN
  Administered 2019-10-28: 40 ug via INTRAVENOUS
  Administered 2019-10-28: 80 ug via INTRAVENOUS
  Administered 2019-10-28 (×2): 40 ug via INTRAVENOUS

## 2019-10-28 MED ORDER — STERILE WATER FOR IRRIGATION IR SOLN
Status: DC | PRN
  Administered 2019-10-28: 1000 mL

## 2019-10-28 MED ORDER — LIDOCAINE 20MG/ML (2%) 15 ML SYRINGE OPTIME
INTRAMUSCULAR | Status: DC | PRN
  Administered 2019-10-28: 1.5 mg/kg/h via INTRAVENOUS

## 2019-10-28 MED ORDER — LACTATED RINGERS IV SOLN: INTRAVENOUS | Status: DC

## 2019-10-28 MED ORDER — CEFAZOLIN SODIUM-DEXTROSE 2-4 GM/100ML-% IV SOLN
2.0000 g | INTRAVENOUS | Status: AC
  Administered 2019-10-28: 2 g via INTRAVENOUS
  Filled 2019-10-28: qty 100

## 2019-10-28 MED ORDER — DEXAMETHASONE SODIUM PHOSPHATE 10 MG/ML IJ SOLN
INTRAMUSCULAR | Status: DC | PRN
  Administered 2019-10-28: 4 mg via INTRAVENOUS

## 2019-10-28 MED ORDER — CELECOXIB 200 MG PO CAPS
400.0000 mg | ORAL_CAPSULE | ORAL | Status: AC
  Administered 2019-10-28: 400 mg via ORAL
  Filled 2019-10-28: qty 2

## 2019-10-28 MED ORDER — STERILE WATER FOR INJECTION IJ SOLN
INTRAMUSCULAR | Status: AC
  Filled 2019-10-28: qty 10

## 2019-10-28 MED ORDER — PHENYLEPHRINE HCL (PRESSORS) 10 MG/ML IV SOLN
INTRAVENOUS | Status: AC
  Filled 2019-10-28: qty 1

## 2019-10-28 MED ORDER — PROMETHAZINE HCL 25 MG/ML IJ SOLN: 6.2500 mg | INTRAMUSCULAR | Status: DC | PRN

## 2019-10-28 MED ORDER — SCOPOLAMINE 1 MG/3DAYS TD PT72
1.0000 | MEDICATED_PATCH | TRANSDERMAL | Status: DC
  Administered 2019-10-28: 1.5 mg via TRANSDERMAL
  Filled 2019-10-28: qty 1

## 2019-10-28 MED ORDER — HYDROMORPHONE HCL 2 MG PO TABS: 2.0000 mg | ORAL_TABLET | ORAL | 0 refills | Status: AC | PRN

## 2019-10-28 MED ORDER — DEXAMETHASONE SODIUM PHOSPHATE 4 MG/ML IJ SOLN: 4.0000 mg | INTRAMUSCULAR | Status: DC

## 2019-10-28 MED ORDER — LACTATED RINGERS IR SOLN
Status: DC | PRN
  Administered 2019-10-28: 1000 mL

## 2019-10-28 MED ORDER — ROCURONIUM BROMIDE 10 MG/ML (PF) SYRINGE
PREFILLED_SYRINGE | INTRAVENOUS | Status: AC
  Filled 2019-10-28: qty 10

## 2019-10-28 MED ORDER — HYDROMORPHONE HCL 2 MG PO TABS: 2.0000 mg | ORAL_TABLET | ORAL | Status: DC | PRN

## 2019-10-28 MED ORDER — MIDAZOLAM HCL 2 MG/2ML IJ SOLN
INTRAMUSCULAR | Status: DC | PRN
  Administered 2019-10-28: 2 mg via INTRAVENOUS

## 2019-10-28 MED ORDER — LIDOCAINE 2% (20 MG/ML) 5 ML SYRINGE
INTRAMUSCULAR | Status: AC
  Filled 2019-10-28: qty 5

## 2019-10-28 MED ORDER — LIDOCAINE HCL 2 % IJ SOLN
INTRAMUSCULAR | Status: AC
  Filled 2019-10-28: qty 20

## 2019-10-28 MED ORDER — PHENYLEPHRINE HCL-NACL 10-0.9 MG/250ML-% IV SOLN
INTRAVENOUS | Status: DC | PRN
  Administered 2019-10-28: 40 ug/min via INTRAVENOUS

## 2019-10-28 MED ORDER — ROCURONIUM BROMIDE 10 MG/ML (PF) SYRINGE
PREFILLED_SYRINGE | INTRAVENOUS | Status: DC | PRN
  Administered 2019-10-28: 60 mg via INTRAVENOUS
  Administered 2019-10-28: 10 mg via INTRAVENOUS
  Administered 2019-10-28: 5 mg via INTRAVENOUS
  Administered 2019-10-28: 20 mg via INTRAVENOUS
  Administered 2019-10-28: 10 mg via INTRAVENOUS

## 2019-10-28 MED ORDER — FENTANYL CITRATE (PF) 100 MCG/2ML IJ SOLN
INTRAMUSCULAR | Status: AC
  Filled 2019-10-28: qty 2

## 2019-10-28 MED ORDER — LIDOCAINE 2% (20 MG/ML) 5 ML SYRINGE
INTRAMUSCULAR | Status: DC | PRN
  Administered 2019-10-28: 40 mg via INTRAVENOUS

## 2019-10-28 MED ORDER — GABAPENTIN 300 MG PO CAPS
300.0000 mg | ORAL_CAPSULE | ORAL | Status: AC
  Administered 2019-10-28: 300 mg via ORAL
  Filled 2019-10-28: qty 1

## 2019-10-28 MED ORDER — ENOXAPARIN SODIUM 40 MG/0.4ML ~~LOC~~ SOLN
40.0000 mg | SUBCUTANEOUS | Status: AC
  Administered 2019-10-28: 40 mg via SUBCUTANEOUS
  Filled 2019-10-28: qty 0.4

## 2019-10-28 MED ORDER — ACETAMINOPHEN 500 MG PO TABS
1000.0000 mg | ORAL_TABLET | ORAL | Status: AC
  Administered 2019-10-28: 1000 mg via ORAL
  Filled 2019-10-28: qty 2

## 2019-10-28 MED ORDER — BUPIVACAINE HCL 0.25 % IJ SOLN
INTRAMUSCULAR | Status: AC
  Filled 2019-10-28: qty 1

## 2019-10-28 MED ORDER — FENTANYL CITRATE (PF) 100 MCG/2ML IJ SOLN: 25.0000 ug | INTRAMUSCULAR | Status: DC | PRN

## 2019-10-28 MED ORDER — PROPOFOL 10 MG/ML IV BOLUS
INTRAVENOUS | Status: DC | PRN
  Administered 2019-10-28: 160 mg via INTRAVENOUS

## 2019-10-28 MED ORDER — DEXAMETHASONE SODIUM PHOSPHATE 10 MG/ML IJ SOLN
INTRAMUSCULAR | Status: AC
  Filled 2019-10-28: qty 1

## 2019-10-28 MED ORDER — PROPOFOL 10 MG/ML IV BOLUS
INTRAVENOUS | Status: AC
  Filled 2019-10-28: qty 20

## 2019-10-28 MED ORDER — SODIUM CHLORIDE 0.9% FLUSH: 3.0000 mL | Freq: Two times a day (BID) | INTRAVENOUS | Status: DC

## 2019-10-28 MED ORDER — ONDANSETRON HCL 4 MG/2ML IJ SOLN
INTRAMUSCULAR | Status: AC
  Filled 2019-10-28: qty 2

## 2019-10-28 MED FILL — HYDROmorphone HCL 2 MG TABS: 2 | 3 days supply | Qty: 10 | Fill #0

## 2019-10-28 MED FILL — FLUCONAZOLE 150 MG TABS: 150 | 1 days supply | Qty: 1 | Fill #0

## 2019-10-28 SURGICAL SUPPLY — 67 items
ADH SKN CLS APL DERMABOND .7 (GAUZE/BANDAGES/DRESSINGS) ×1
APPLICATOR SURGIFLO ENDO (HEMOSTASIS) IMPLANT
BACTOSHIELD CHG 4% 4OZ (MISCELLANEOUS) ×1
BAG LAPAROSCOPIC 12 15 PORT 16 (BASKET) ×1 IMPLANT
BAG RETRIEVAL 12/15 (BASKET) ×2
BLADE SURG SZ10 CARB STEEL (BLADE) IMPLANT
COVER BACK TABLE 60X90IN (DRAPES) ×2 IMPLANT
COVER TIP SHEARS 8 DVNC (MISCELLANEOUS) ×1 IMPLANT
COVER TIP SHEARS 8MM DA VINCI (MISCELLANEOUS) ×2
COVER WAND RF STERILE (DRAPES) IMPLANT
DECANTER SPIKE VIAL GLASS SM (MISCELLANEOUS) IMPLANT
DERMABOND ADVANCED (GAUZE/BANDAGES/DRESSINGS) ×1
DERMABOND ADVANCED .7 DNX12 (GAUZE/BANDAGES/DRESSINGS) ×1 IMPLANT
DRAPE ARM DVNC X/XI (DISPOSABLE) ×4 IMPLANT
DRAPE COLUMN DVNC XI (DISPOSABLE) ×1 IMPLANT
DRAPE DA VINCI XI ARM (DISPOSABLE) ×8
DRAPE DA VINCI XI COLUMN (DISPOSABLE) ×2
DRAPE SHEET LG 3/4 BI-LAMINATE (DRAPES) ×2 IMPLANT
DRAPE SURG IRRIG POUCH 19X23 (DRAPES) ×2 IMPLANT
DRSG OPSITE POSTOP 4X6 (GAUZE/BANDAGES/DRESSINGS) IMPLANT
DRSG OPSITE POSTOP 4X8 (GAUZE/BANDAGES/DRESSINGS) IMPLANT
ELECT REM PT RETURN 15FT ADLT (MISCELLANEOUS) ×2 IMPLANT
GAUZE 4X4 16PLY RFD (DISPOSABLE) ×2 IMPLANT
GLOVE BIO SURGEON STRL SZ 6 (GLOVE) ×8 IMPLANT
GLOVE BIO SURGEON STRL SZ 6.5 (GLOVE) ×4 IMPLANT
GOWN STRL REUS W/ TWL LRG LVL3 (GOWN DISPOSABLE) ×4 IMPLANT
GOWN STRL REUS W/TWL LRG LVL3 (GOWN DISPOSABLE) ×8
HOLDER FOLEY CATH W/STRAP (MISCELLANEOUS) ×2 IMPLANT
IRRIG SUCT STRYKERFLOW 2 WTIP (MISCELLANEOUS) ×2
IRRIGATION SUCT STRKRFLW 2 WTP (MISCELLANEOUS) ×1 IMPLANT
KIT PROCEDURE DA VINCI SI (MISCELLANEOUS)
KIT PROCEDURE DVNC SI (MISCELLANEOUS) IMPLANT
KIT TURNOVER KIT A (KITS) IMPLANT
MANIPULATOR UTERINE 4.5 ZUMI (MISCELLANEOUS) ×2 IMPLANT
NEEDLE HYPO 22GX1.5 SAFETY (NEEDLE) ×2 IMPLANT
NEEDLE SPNL 18GX3.5 QUINCKE PK (NEEDLE) IMPLANT
OBTURATOR OPTICAL STANDARD 8MM (TROCAR) ×2
OBTURATOR OPTICAL STND 8 DVNC (TROCAR) ×1
OBTURATOR OPTICALSTD 8 DVNC (TROCAR) ×1 IMPLANT
PACK ROBOT GYN CUSTOM WL (TRAY / TRAY PROCEDURE) ×2 IMPLANT
PAD POSITIONING PINK XL (MISCELLANEOUS) ×2 IMPLANT
PENCIL SMOKE EVACUATOR (MISCELLANEOUS) IMPLANT
PORT ACCESS TROCAR AIRSEAL 12 (TROCAR) ×1 IMPLANT
PORT ACCESS TROCAR AIRSEAL 5M (TROCAR) ×1
POUCH SPECIMEN RETRIEVAL 10MM (ENDOMECHANICALS) IMPLANT
SCRUB CHG 4% DYNA-HEX 4OZ (MISCELLANEOUS) ×1 IMPLANT
SEAL CANN UNIV 5-8 DVNC XI (MISCELLANEOUS) ×3 IMPLANT
SEAL XI 5MM-8MM UNIVERSAL (MISCELLANEOUS) ×6
SET TRI-LUMEN FLTR TB AIRSEAL (TUBING) ×2 IMPLANT
SPONGE LAP 18X18 RF (DISPOSABLE) IMPLANT
SURGIFLO W/THROMBIN 8M KIT (HEMOSTASIS) IMPLANT
SUT MNCRL AB 4-0 PS2 18 (SUTURE) IMPLANT
SUT PDS AB 1 TP1 96 (SUTURE) IMPLANT
SUT VIC AB 0 CT1 27 (SUTURE)
SUT VIC AB 0 CT1 27XBRD ANTBC (SUTURE) IMPLANT
SUT VIC AB 2-0 CT1 27 (SUTURE)
SUT VIC AB 2-0 CT1 TAPERPNT 27 (SUTURE) IMPLANT
SUT VIC AB 3-0 SH 27 (SUTURE) ×2
SUT VIC AB 3-0 SH 27XBRD (SUTURE) ×1 IMPLANT
SUT VICRYL 4-0 PS2 18IN ABS (SUTURE) ×4 IMPLANT
SYR 10ML LL (SYRINGE) IMPLANT
TOWEL OR NON WOVEN STRL DISP B (DISPOSABLE) ×2 IMPLANT
TRAP SPECIMEN MUCUS 40CC (MISCELLANEOUS) IMPLANT
TRAY FOLEY MTR SLVR 16FR STAT (SET/KITS/TRAYS/PACK) ×2 IMPLANT
TROCAR XCEL NON-BLD 5MMX100MML (ENDOMECHANICALS) IMPLANT
UNDERPAD 30X36 HEAVY ABSORB (UNDERPADS AND DIAPERS) ×2 IMPLANT
WATER STERILE IRR 1000ML POUR (IV SOLUTION) ×2 IMPLANT

## 2019-10-28 NOTE — Telephone Encounter (Signed)
Returned the patient's call and explained that we would call her scripts into her pharmacy for her post op meds. Patient requested something for her yeast infection from all the antibiotics she has been on. Also the patient asked about the removal of her other ovary. Explained that I would forward the message to [facility] APP and someone would call her back. Patient verbalized understanding.

## 2019-10-28 NOTE — Anesthesia Preprocedure Evaluation (Addendum)
Anesthesia Evaluation  Patient identified by MRN, date of birth, ID band Patient awake    Reviewed: Allergy & Precautions, NPO status , Patient's Chart, lab work & pertinent test results  History of Anesthesia Complications (+) PONV and history of anesthetic complications  Airway Mallampati: III  TM Distance: >3 FB Neck ROM: Full    Dental no notable dental hx.    Pulmonary neg pulmonary ROS,    Pulmonary exam normal breath sounds clear to auscultation       Cardiovascular negative cardio ROS Normal cardiovascular exam Rhythm:Regular Rate:Normal     Neuro/Psych  Headaches, PSYCHIATRIC DISORDERS Anxiety Depression    GI/Hepatic negative GI ROS, Neg liver ROS,   Endo/Other  negative endocrine ROS  Renal/GU negative Renal ROS     Musculoskeletal negative musculoskeletal ROS (+)   Abdominal (+) + obese,   Peds  Hematology negative hematology ROS (+)   Anesthesia Other Findings OVARIAN MASS; ELEVATED CA125  Reproductive/Obstetrics hcg negative                            Anesthesia Physical Anesthesia Plan  ASA: I  Anesthesia Plan: General   Post-op Pain Management:    Induction: Intravenous  PONV Risk Score and Plan: 4 or greater and Scopolamine patch - Pre-op, Midazolam, Propofol infusion, Dexamethasone, Ondansetron and Treatment may vary due to age or medical condition  Airway Management Planned: Oral ETT  Additional Equipment:   Intra-op Plan:   Post-operative Plan: Extubation in OR  Informed Consent: I have reviewed the patients History and Physical, chart, labs and discussed the procedure including the risks, benefits and alternatives for the proposed anesthesia with the patient or authorized representative who has indicated his/her understanding and acceptance.     Dental advisory given  Plan Discussed with: CRNA  Anesthesia Plan Comments:        Anesthesia  Quick Evaluation

## 2019-10-28 NOTE — Discharge Instructions (Signed)
10/28/2019  Return to work: 2-4 weeks  Activity: 1. Be up and out of the bed during the day.  Take a nap if needed.  You may walk up steps but be careful and use the hand rail.  Stair climbing will tire you more than you think, you may need to stop part way and rest.   2. No lifting or straining for 4 weeks.  3. No driving for 1 week.  Do Not drive if you are taking narcotic pain medicine.  4. Shower daily.  Use soap and water on your incision and pat dry; don't rub.   5. No sexual activity and nothing in the vagina for 8 weeks.  Medications:  - Take ibuprofen and tylenol first line for pain control. Take these regularly (every 6 hours) to decrease the build up of pain.  - If necessary, for severe pain not relieved by ibuprofen, take oxycodone.  - While taking oxycodone you should take sennakot every night to reduce the likelihood of constipation. If this causes diarrhea, stop its use.  Diet: 1. Low sodium Heart Healthy Diet is recommended.  2. It is safe to use a laxative if you have difficulty moving your bowels.   Wound Care: 1. Keep clean and dry.  Shower daily.  Reasons to call the Doctor:   Fever - Oral temperature greater than 100.4 degrees Fahrenheit  Foul-smelling vaginal discharge  Difficulty urinating  Nausea and vomiting  Increased pain at the site of the incision that is unrelieved with pain medicine.  Difficulty breathing with or without chest pain  New calf pain especially if only on one side  Sudden, continuing increased vaginal bleeding with or without clots.   Follow-up: 1. See Everitt Amber in 3-4 weeks.  Contacts: For questions or concerns you should contact:  Dr. Everitt Amber at [phone] After hours and on week-ends call [phone] and ask to speak to the physician on call for Gynecologic Oncology  After Your Surgery  The information in this section will tell you what to expect after your surgery, both during your stay and after you  leave. You will learn how to safely recover from your surgery. Write down any questions you have and be sure to ask your doctor or nurse.  What to Expect When you wake up after your surgery, you will be in the [facility] (PACU) or your recovery room. A nurse will be monitoring your body temperature, blood pressure, pulse, and oxygen levels. You may have a urinary catheter in your bladder to help monitor the amount of urine you are making. It should come out before you go home. You will also have compression boots on your lower legs to help your circulation. Your pain medication will be given through an IV line or in tablet form. If you are having pain, tell your nurse. Your nurse will tell you how to recover from your surgery. Below are examples of ways you can help yourself recover safely. . You will be encouraged to walk with the help of your nurse or physical therapist. We will give you medication to relieve pain. Walking helps reduce the risk for blood clots and pneumonia. It also helps to stimulate your bowels so they begin working again. . Use your incentive spirometer. This will help your lungs expand, which prevents pneumonia.   Commonly Asked Questions  Will I have pain after surgery? Yes, you will have some pain after your surgery, especially in the first few days. Your  doctor and nurse will ask you about your pain often. You will be given medication to manage your pain as needed. If your pain is not relieved, please tell your doctor or nurse. It is important to control your pain so you can cough, breathe deeply, use your incentive spirometer, and get out of bed and walk.  Will I be able to eat? Yes, you will be able to eat a regular diet or eat as tolerated. You should start with foods that are soft and easy to digest such as apple sauce and chicken noodle soup. Eat small meals frequently, and then advance to regular foods. If you experience bloating, gas, or cramps,  limit high-fiber foods, including whole grain breads and cereal, nuts, seeds, salads, fresh fruit, broccoli, cabbage, and cauliflower. Will I have pain when I am home? The length of time each person has pain or discomfort varies. You may still have some pain when you go home and will probably be taking pain medication. Follow the guidelines below. . Take your medications as directed and as needed. . Call your doctor if the medication prescribed for you doesn't relieve your pain. . Don't drive or drink alcohol while you're taking prescription pain medication. . As your incision heals, you will have less pain and need less pain medication. A mild pain reliever such as acetaminophen (Tylenol) or ibuprofen (Advil) will relieve aches and discomfort. However, large quantities of acetaminophen may be harmful to your liver. Don't take more acetaminophen than the amount directed on the bottle or as instructed by your doctor or nurse. . Pain medication should help you as you resume your normal activities. Take enough medication to do your exercises comfortably. Pain medication is most effective 30 to 45 minutes after taking it. Marland Kitchen Keep track of when you take your pain medication. Taking it when your pain first begins is more effective than waiting for the pain to get worse. Pain medication may cause constipation (having fewer bowel movements than what is normal for you).  How can I prevent constipation? . Go to the bathroom at the same time every day. Your body will get used to going at that time. . If you feel the urge to go, don't put it off. Try to use the bathroom 5 to 15 minutes after meals. . After breakfast is a good time to move your bowels. The reflexes in your colon are strongest at this time. . Exercise, if you can. Walking is an excellent form of exercise. . Drink 8 (8-ounce) glasses (2 liters) of liquids daily, if you can. Drink water, juices, soups, ice cream shakes, and other drinks that don't  have caffeine. Drinks with caffeine, such as coffee and soda, pull fluid out of the body. . Slowly increase the fiber in your diet to 25 to 35 grams per day. Fruits, vegetables, whole grains, and cereals contain fiber. If you have an ostomy or have had recent bowel surgery, check with your doctor or nurse before making any changes in your diet. . Both over-the-counter and prescription medications are available to treat constipation. Start with 1 of the following over-the-counter medications first: o Docusate sodium (Colace) 100 mg. Take ___1__ capsules _2____ times a day. This is a stool softener that causes few side effects. Don't take it with mineral oil. o Polyethylene glycol (MiraLAX) 17 grams daily. o Senna (Senokot) 2 tablets at bedtime. This is a stimulant laxative, which can cause cramping. . If you haven't had a bowel movement in 2 days,  call your doctor or nurse.  Can I shower? Yes, you should shower 24 hours after your surgery. Be sure to shower every day. Taking a warm shower is relaxing and can help decrease muscle aches. Use soap when you shower and gently wash your incision. Pat the areas dry with a towel after showering, and leave your incision uncovered (unless there is drainage). Call your doctor if you see any redness or drainage from your incision. Don't take tub baths until you discuss it with your doctor at the first appointment after your surgery. How do I care for my incisions? You will have several small incisions on your abdomen. The incisions are closed with Steri-Strips or Dermabond. You may also have square white dressings on your incisions ([place]). You can remove these in the shower 24 hours after your surgery. You should clean your incisions with soap and water. If you go home with Steri-Strips on your incision, they will loosen and may fall off by themselves. If they haven't fallen off within 10 days, you can remove them. If you go home with Dermabond over your  sutures (stitches), it will also loosen and peel off.  What are the most common symptoms after a hysterectomy? It's common for you to have some vaginal spotting or light bleeding. You should monitor this with a pad or a panty liner. If you have having heavy bleeding (bleeding through a pad or liner every 1 to 2 hours), call your doctor right away. It's also common to have some discomfort after surgery from the air that was pumped into your abdomen during surgery. To help with this, walk, drink plenty of liquids and make sure to take the stool softeners you received.  When is it safe for me to drive? You may resume driving 2 weeks after surgery, as long as you are not taking pain medication that may make you drowsy.  When can I resume sexual activity? Do not place anything in your vagina or have vaginal intercourse for 8 weeks after your surgery. Some people will need to wait longer than 8 weeks, so speak with your doctor before resuming sexual intercourse.  Will I be able to travel? Yes, you can travel. If you are traveling by plane within a few weeks after your surgery, make sure you get up and walk every hour. Be sure to stretch your legs, drink plenty of liquids, and keep your feet elevated when possible.  Will I need any supplies? Most people do not need any supplies after the surgery. In the rare case that you do need supplies, such as tubes or drains, your nurse will order them for you.  When can I return to work? The time it takes to return to work depends on the type of work you do, the type of surgery you had, and how fast your body heals. Most people can return to work about 2 to 4 weeks after the surgery.  What exercises can I do? Exercise will help you gain strength and feel better. Walking and stair climbing are excellent forms of exercise. Gradually increase the distance you walk. Climb stairs slowly, resting or stopping as needed. Ask your doctor or nurse before starting more  strenuous exercises.  When can I lift heavy objects? Most people should not lift anything heavier than 10 pounds (4.5 kilograms) for at least 4 weeks after surgery. Speak with your doctor about when you can do heavy lifting.  How can I cope with my feelings? After surgery for  a serious illness, you may have new and upsetting feelings. Many people say they felt weepy, sad, worried, nervous, irritable, and angry at one time or another. You may find that you can't control some of these feelings. If this happens, it's a good idea to seek emotional support. The first step in coping is to talk about how you feel. Family and friends can help. Your nurse, doctor, and social worker can reassure, support, and guide you. It's always a good idea to let these professionals know how you, your family, and your friends are feeling emotionally. Many resources are available to patients and their families. Whether you're in the hospital or at home, the nurses, doctors, and social workers are here to help you and your family and friends handle the emotional aspects of your illness.  When is my first appointment after surgery? Your first appointment after surgery will be 2 to 4 weeks after surgery. Your nurse will give you instructions on how to make this appointment, including the phone number to call.  What if I have other questions? If you have any questions or concerns, please talk with your doctor or nurse. You can reach them Monday through Friday from 9:00 am to 5:00 pm. After 5:00 pm, during the weekend, and on holidays, call [phone] and ask for the doctor on call for your doctor.  . Have a temperature of 101 F (38.3 C) or higher . Have pain that does not get better with pain medication . Have redness, drainage, or swelling from your incisions

## 2019-10-28 NOTE — Transfer of Care (Signed)
Immediate Anesthesia Transfer of Care Note  Patient: Heather Stephens  Procedure(s) Performed: XI ROBOTIC ASSISTED TOTAL HYSTERECTOMY WITH UNILATERAL SALPINGO OOPHORECTOMY, LYSIS OF ADHESIONS (N/A )  Patient Location: PACU  Anesthesia Type:General  Level of Consciousness: drowsy  Airway & Oxygen Therapy: Patient Spontanous Breathing and Patient connected to face mask oxygen  Post-op Assessment: Report given to RN and Post -op Vital signs reviewed and stable  Post vital signs: Reviewed and stable  Last Vitals:  Vitals Value Taken Time  BP 125/77 10/28/19 1542  Temp    Pulse 88 10/28/19 1544  Resp 21 10/28/19 1544  SpO2 100 % 10/28/19 1544  Vitals shown include unvalidated device data.  Last Pain:  Vitals:   10/28/19 1103  TempSrc:   PainSc: 2       Patients Stated Pain Goal: 3 (15/37/94 3276)  Complications: No complications documented.

## 2019-10-28 NOTE — Anesthesia Postprocedure Evaluation (Signed)
Anesthesia Post Note  Patient: PIETRINA JAGODZINSKI  Procedure(s) Performed: XI ROBOTIC ASSISTED TOTAL HYSTERECTOMY WITH UNILATERAL SALPINGO OOPHORECTOMY, LYSIS OF ADHESIONS (N/A )     Patient location during evaluation: PACU Anesthesia Type: General Level of consciousness: awake and alert Pain management: pain level controlled Vital Signs Assessment: post-procedure vital signs reviewed and stable Respiratory status: spontaneous breathing, nonlabored ventilation, respiratory function stable and patient connected to nasal cannula oxygen Cardiovascular status: blood pressure returned to baseline and stable Postop Assessment: no apparent nausea or vomiting Anesthetic complications: no   No complications documented.  Last Vitals:  Vitals:   10/28/19 1635 10/28/19 1745  BP: 123/77 125/80  Pulse: 83 96  Resp: 18 (!) 22  Temp: 36.7 C 36.6 C  SpO2: 96% 96%    Last Pain:  Vitals:   10/28/19 1635  TempSrc:   PainSc: 4                  Haidyn Chadderdon P Lorrene Graef

## 2019-10-28 NOTE — Interval H&P Note (Signed)
History and Physical Interval Note:  10/28/2019 12:30 PM  Heather Stephens  has presented today for surgery, with the diagnosis of OVARIAN MASS; ELEVATED CA125.  The various methods of treatment have been discussed with the patient and family. After consideration of risks, benefits and other options for treatment, the patient has consented to  Procedure(s): XI ROBOTIC ASSISTED TOTAL HYSTERECTOMY WITH UNILATERAL SALPINGO OOPHORECTOMY/POSSIBLE STAGING (N/A) as a surgical intervention.  The patient's history has been reviewed, patient examined, no change in status, stable for surgery.  I have reviewed the patient's chart and labs.  Questions were answered to the patient's satisfaction.     Thereasa Solo

## 2019-10-28 NOTE — Anesthesia Procedure Notes (Signed)
Procedure Name: Intubation Date/Time: 10/28/2019 1:07 PM Performed by: Eben Burow, CRNA Pre-anesthesia Checklist: Patient identified, Emergency Drugs available, Suction available, Patient being monitored and Timeout performed Patient Re-evaluated:Patient Re-evaluated prior to induction Oxygen Delivery Method: Circle system utilized Preoxygenation: Pre-oxygenation with 100% oxygen Induction Type: IV induction Ventilation: Mask ventilation without difficulty Laryngoscope Size: Mac and 4 Grade View: Grade I Tube type: Oral Tube size: 7.0 mm Number of attempts: 1 Airway Equipment and Method: Stylet Placement Confirmation: ETT inserted through vocal cords under direct vision,  positive ETCO2 and breath sounds checked- equal and bilateral Secured at: 21 cm Tube secured with: Tape Dental Injury: Teeth and Oropharynx as per pre-operative assessment

## 2019-10-28 NOTE — Op Note (Signed)
OPERATIVE NOTE 10/28/19  Surgeon: Donaciano Eva   Assistants: Dr Lahoma Crocker (an MD assistant was necessary for tissue manipulation, management of robotic instrumentation, retraction and positioning due to the complexity of the case and hospital policies).   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   Operation: Robotic-assisted laparoscopic total hysterectomy with left salpingoophorectomy, lysis of adhesions.  Surgeon: Donaciano Eva  Assistant Surgeon: Lahoma Crocker MD  Anesthesia: GET  Urine Output: 250cc  Operative Findings:  : 15cm left ovarian/para-ovarian mass with apparent torsion, necrotic, densely adherent to the small intestine and sigmoid colon. Necrotic left ovary. Normal appearing right ovary with normal tube. Normal upper abdomen. Small volume clear ascites.    Estimated Blood Loss:  50cc      Total IV Fluids: 800 ml         Specimens: washings, left tube and ovary, uterus with cervix and right tube, sigmoid colon epiploica.          Complications:  None; patient tolerated the procedure well.         Disposition: PACU - hemodynamically stable.  Procedure Details  The patient was seen in the Holding Room. The risks, benefits, complications, treatment options, and expected outcomes were discussed with the patient.  The patient concurred with the proposed plan, giving informed consent.  The site of surgery properly noted/marked. The patient was identified as Heather Stephens and the procedure verified as a Robotic-assisted hysterectomy with unilateral salpingo oophorectomy. A Time Out was held and the above information confirmed.  After induction of anesthesia, the patient was draped and prepped in the usual sterile manner. Pt was placed in  supine position after anesthesia and draped and prepped in the usual sterile manner. The abdominal drape was placed after the CholoraPrep had been allowed to dry for 3 minutes.  Her arms were tucked to her side with all appropriate precautions.  The shoulders were stabilized with padded shoulder blocks applied to the acromium processes.  The patient was placed in the semi-lithotomy position in [place].  The perineum was prepped with Betadine. The patient was then prepped. Foley catheter was placed.  A sterile speculum was placed in the vagina.  The cervix was grasped with a single-tooth tenaculum and dilated with Kennon Rounds dilators.  The ZUMI uterine manipulator with a medium colpotomizer ring was placed without difficulty.  A pneum occluder balloon was placed over the manipulator.  OG tube placement was confirmed and to suction.   Next, a 5 mm skin incision was made 1 cm below the subcostal margin in the midclavicular line.  The 5 mm Optiview port and scope was used for direct entry.  Opening pressure was under 10 mm CO2.  The abdomen was insufflated and the findings were noted as above.   At this point and all points during the procedure, the patient's intra-abdominal pressure did not exceed 15 mmHg. Next, a 10 mm skin incision was made 2cm above the umbilicus and a right and left port was placed about 10 cm lateral to the robot port on the right and left side.  A fourth arm was placed in the left lower quadrant 2 cm above and superior and medial to the anterior superior iliac spine.  All ports were placed under direct visualization.  The patient was placed in steep Trendelenburg.  Bowel was  folded away into the upper abdomen.  The robot was docked in the normal manner.  For approximately 40 minutes, careful sharp adhesiolysis was performed to separate the small bowel loops from their attachments to the left ovarian mass.   This exposed the left ovarian mass which was clearly necrotic and densely adherent  to the anterior abdominal wall. It was carefully separated from the peritoneal attachments.  At this point a torsion in the blood supply of the left tube was apparent explaining the necrotic mass. Due to the friable nature of the necrotic mass, it easily fragmented with manipulation. The ovary was separated from its attachments to the uterus and the ovarian vessels and placed in an endocatch bag.   The hysterectomy was started after the round ligament on the right side was incised and the retroperitoneum was entered and the pararectal space was developed.  The ureter was noted to be on the medial leaf of the broad ligament.  The peritoneum above the ureter was incised and stretched and the utero-ovarian ligament was skeletonized, cauterized and cut. The right fallopian tube was separated from its ovarian attachments and remained attached to the uterus. The posterior peritoneum was taken down to the level of the KOH ring.  The anterior peritoneum was also taken down.  The bladder flap was created to the level of the KOH ring.  The uterine artery on the right side was skeletonized, cauterized and cut in the normal manner.  A similar procedure was performed on the left.  The colpotomy was made and the uterus, cervix, right tube were amputated and delivered through the vagina.The left tube and ovary were placed in an endocatch bag and removed from the vagina and sent for frozen section which revealed benign stromal tumor (fibroma).  Pedicles were inspected and excellent hemostasis was achieved.    The small and large bowel were carefully inspected and an area of bleeding was noted in the mesentery approximating the bowel wall of the distal ileum. It was made hemostatic with an interrupted figure of 8 suture with care to not narrow the lumen of the bowel in doing so.   The colpotomy at the vaginal cuff was closed with Vicryl on a CT1 needle in a running manner.  Irrigation was used and excellent hemostasis was  achieved.  At this point in the procedure was completed.  Robotic instruments were removed under direct visulaization.  The robot was undocked. The 10 mm ports were closed with Vicryl on a UR-5 needle and the fascia was closed with 0 Vicryl on a UR-5 needle.  The skin was closed with 4-0 Vicryl in a subcuticular manner.  Dermabond was applied.  Sponge, lap and needle counts correct x 2.  The patient was taken to the recovery room in stable condition.  The vagina was swabbed with  minimal bleeding noted.   All instrument and needle counts were correct x  3.   The patient was transferred to the recovery room in a stable condition.  Donaciano Eva, MD

## 2019-10-29 ENCOUNTER — Encounter (HOSPITAL_COMMUNITY): Payer: Self-pay | Admitting: Gynecologic Oncology

## 2019-10-29 ENCOUNTER — Telehealth: Payer: Self-pay

## 2019-10-29 NOTE — Telephone Encounter (Signed)
Spoke with pt post op surgery 10/28/19.  She reports she had a rough night with pain last night but is feeling much better today.  Advised her that she can use heating pad as needed for pain but she reports she is already using it.  Patient is eating, drinking and urinating well.  Denies temp.  Incisions are dry and intact.  Patient is passing gas but has not had a bowel movement yet.  Pt reports that she took Senokot this morning.  Nurse advised pt that she can increase Senokot to twice a day if needed and incorporate Miralax twice a day if needed.  Patient voiced understanding and thanks for call.

## 2019-10-30 LAB — CYTOLOGY - NON PAP

## 2019-10-31 LAB — SURGICAL PATHOLOGY

## 2019-11-03 ENCOUNTER — Telehealth: Payer: Self-pay

## 2019-11-03 NOTE — Telephone Encounter (Signed)
Told Heather Stephens that the pathology showed no cancer.  Benign pathology.

## 2019-11-17 ENCOUNTER — Encounter: Payer: Self-pay | Admitting: Gynecologic Oncology

## 2019-11-18 ENCOUNTER — Encounter: Payer: Self-pay | Admitting: Gynecologic Oncology

## 2019-11-18 ENCOUNTER — Inpatient Hospital Stay: Payer: Self-pay | Attending: Gynecologic Oncology | Admitting: Gynecologic Oncology

## 2019-11-18 ENCOUNTER — Other Ambulatory Visit: Payer: Self-pay

## 2019-11-18 VITALS — BP 104/66 | HR 81 | Temp 98.7°F | Resp 18 | Wt 175.4 lb

## 2019-11-18 DIAGNOSIS — Z90710 Acquired absence of both cervix and uterus: Secondary | ICD-10-CM

## 2019-11-18 DIAGNOSIS — D271 Benign neoplasm of left ovary: Secondary | ICD-10-CM

## 2019-11-18 DIAGNOSIS — D279 Benign neoplasm of unspecified ovary: Secondary | ICD-10-CM

## 2019-11-18 DIAGNOSIS — Z90721 Acquired absence of ovaries, unilateral: Secondary | ICD-10-CM

## 2019-11-18 NOTE — Progress Notes (Signed)
Postop Visit Note: Gyn-Onc  Heather Stephens 49 y.o. female  CC:  Chief Complaint  Patient presents with  . Ovarian Stromal Tumor    follow up    Assessment/Plan:  Heather Stephens  is a 49 y.o.  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  Her surgery was complicated by adhesive disease requiring lysis of adhesions.  She is doing well postoperatively with no complications.  She had benign pathology and does not require additional procedures, interventions, or follow-up for this.  She will continue to follow-up with primary care physicians for general wellbeing.  HPI: Heather Stephens is a 49 year old P1 who was seen in consultation at the request of the ER for evaluation of a large pelvic mass, and mildly elevated CA-125.  The patient reported symptoms of profuse mucoid vaginal discharge for several years.  She denied receiving medical care in the preceding 11 years due to lack of health insurance which she attributes to the introduction of the affordable care act.  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  She began experiencing suprapubic and right lower quadrant pain in August 2021 and was seen at [facility] emergency department for this symptom on October 13, 2019.  This prompted a CT of the abdomen and pelvis to be performed which revealed a subcentimeter low-attenuation lesion in the liver too small to characterize but likely a cyst.  Numerous colonic diverticula noted particular in the descending colon and sigmoid colon with inflammatory changes noted to the adjacent distal descending colon compatible with acute diverticulitis.  No signs of perforation.  No lymphadenopathy.  There is a large heterogeneously enhancing cystic mass in the central pelvis which appeared to emanate  from the left adnexa which was presumably a large ovarian mass measured 11.2 x 7.7 x 9.8 cm.  The uterus and right ovary were unremarkable.  There were no ascites or pneumoperitoneum.  A Ca1 25 was drawn on October 13, 2019 it was mildly elevated at 50.  CEA was normal at 1.4.  A transvaginal ultrasound scan was performed to follow-up the CT scan findings, this was performed on the same day.  It showed a large heterogeneous mostly solid-appearing mass within the pelvis measuring up to 12.9 cm.  The mass was in the midline and could not be localized to either ovary due to its size.  Both ovaries were able to be seen as discrete structures with fluid present on the left.  The uterus measured 8.2 x 4.2 x 5.7 cm with a 5 mm endometrium.  She was treated empirically for diverticulitis with ciprofloxacin and Flagyl which was changed to Augmentin when she developed nausea on the Flagyl.  She denied fever and denied an elevated white blood cell count.  She denied symptoms of hematochezia or constipation.  Her medical history is most significant for an absence of health care in the 11 years preceding her consultation with me.  She denied other underlying illness.  She was not vaccinated against COVID-19 due to concern regarding the safety of the vaccine.  She declined proceeding with vaccination.  Preoperative endometrial biopsy was benign.  Interval Hx:  On 10/28/19 she underwent robotic assisted total hysterectomy with LSO and right salpingectomy and lysis of adhesions. Intraoperative findings were significant for a 15 cm left ovarian  mass with apparent torsion, necrotic, densely adherent to the small intestine sigmoid colon.  Necrotic left ovary.  Normal-appearing right ovary with normal tube.  Normal upper abdomen.  Small volume of clear ascites.  Frozen section revealed a benign left ovarian mass. Surgery was challenging due to the complex lysis of adhesions but uncomplicated.  Final pathology revealed a left  ovarian fibroma with necrosis and hemorrhage, benign fallopian tubes bilaterally, adenomyosis of the uterus but no hyperplasia or malignancy.  Since surgery she has done well overall with some discomfort with evacuation of bladder and bowels but no other specific abnormalities..   Current Meds:  Outpatient Encounter Medications as of 11/18/2019  Medication Sig  . ibuprofen (ADVIL) 200 MG tablet Take 400 mg by mouth every 6 (six) hours as needed.  . [DISCONTINUED] amoxicillin-clavulanate (AUGMENTIN) 875-125 MG tablet Take 1 tablet by mouth 2 (two) times daily.  . [DISCONTINUED] promethazine (PHENERGAN) 12.5 MG suppository Place 12.5 mg rectally every 6 (six) hours as needed for nausea or vomiting.    No facility-administered encounter medications on file as of 11/18/2019.    Allergy:  Allergies  Allergen Reactions  . Sumatriptan Shortness Of Breath  . Metronidazole Nausea And Vomiting  . Codeine Itching    Social Hx:   Social History   Socioeconomic History  . Marital status: Married    Spouse name: Not on file  . Number of children: 1  . Years of education: 110  . Highest education level: Not on file  Occupational History  . Not on file  Tobacco Use  . Smoking status: Never Smoker  . Smokeless tobacco: Never Used  Vaping Use  . Vaping Use: Never used  Substance and Sexual Activity  . Alcohol use: No  . Drug use: No  . Sexual activity: Not on file  Other Topics Concern  . Not on file  Social History Narrative  . Not on file   Social Determinants of Health   Financial Resource Strain:   . Difficulty of Paying Living Expenses: Not on file  Food Insecurity:   . Worried About Charity fundraiser in the Last Year: Not on file  . Ran Out of Food in the Last Year: Not on file  Transportation Needs:   . Lack of Transportation (Medical): Not on file  . Lack of Transportation (Non-Medical): Not on file  Physical Activity:   . Days of Exercise per Week: Not on file  .  Minutes of Exercise per Session: Not on file  Stress:   . Feeling of Stress : Not on file  Social Connections:   . Frequency of Communication with Friends and Family: Not on file  . Frequency of Social Gatherings with Friends and Family: Not on file  . Attends Religious Services: Not on file  . Active Member of Clubs or Organizations: Not on file  . Attends Archivist Meetings: Not on file  . Marital Status: Not on file  Intimate Partner Violence:   . Fear of Current or Ex-Partner: Not on file  . Emotionally Abused: Not on file  . Physically Abused: Not on file  . Sexually Abused: Not on file    Past Surgical Hx:  Past Surgical History:  Procedure Laterality Date  . ABLATION    . benign cyst removed from right arm     . BUNIONECTOMY     right foot  . ROBOTIC ASSISTED TOTAL HYSTERECTOMY WITH BILATERAL SALPINGO OOPHERECTOMY N/A 10/28/2019   Procedure: XI ROBOTIC ASSISTED  TOTAL HYSTERECTOMY WITH UNILATERAL SALPINGO OOPHORECTOMY, LYSIS OF ADHESIONS;  Surgeon: Everitt Amber, MD;  Location: [facility];  Service: Gynecology;  Laterality: N/A;    Past Medical Hx:  Past Medical History:  Diagnosis Date  . Anxiety   . Depression   . Diverticulitis   . Elevated CA-125   . Migraine headache   . Ovarian mass   . PONV (postoperative nausea and vomiting)     Past Gynecological History:  Se HPI No LMP recorded. Patient has had an ablation.  Family Hx:  Family History  Problem Relation Age of Onset  . Cancer Paternal Grandmother        Cancer all over  . Breast cancer Neg Hx   . Ovarian cancer Neg Hx   . Endometrial cancer Neg Hx     Review of Systems:  Constitutional  Feels well ENT Normal appearing ears and nares bilaterally Skin/Breast  No rash, sores, jaundice, itching, dryness Cardiovascular  No chest pain, shortness of breath, or edema  Pulmonary  No cough or wheeze.  Gastro Intestinal  No nausea, vomitting, or diarrhoea. No bright red blood per rectum, no  abdominal pain, change in bowel movement, or constipation.  Genito Urinary  No frequency, urgency, dysuria,  No discharge Musculo Skeletal  No myalgia, arthralgia, joint swelling or pain  Neurologic  No weakness, numbness, change in gait,  Psychology  No depression, anxiety, insomnia.   Vitals:  Blood pressure 104/66, pulse 81, temperature 98.7 F (37.1 C), resp. rate 18, weight 175 lb 6.4 oz (79.6 kg), SpO2 100 %.  Physical Exam: WD in NAD Neck  Supple NROM, without any enlargements.  Lymph Node Survey No cervical supraclavicular or inguinal adenopathy Cardiovascular  Pulse normal rate, regularity and rhythm. S1 and S2 normal.  Lungs  Clear to auscultation bilateraly, without wheezes/crackles/rhonchi. Good air movement.  Skin  No rash/lesions/breakdown  Psychiatry  Alert and oriented to person, place, and time  Abdomen  Normoactive bowel sounds, abdomen soft, non-tender and mildly obese without evidence of hernia. Well healed inicisions.  Back No CVA tenderness Genito Urinary  Well healed vaginal cuff with no bleeding or discharge. No gapping.  Rectal  deferred.  Extremities  No bilateral cyanosis, clubbing or edema.   Thereasa Solo, MD  11/18/2019, 1:27 PM

## 2019-11-18 NOTE — Patient Instructions (Signed)
One more week of light activities then return to no restrictions (eg no lifting restrictions).  Avoid seeds, nuts, popcorn, skins from fruit and veggies.  No intercourse for another 5 weeks.  You can resume care with your primary care doctor. You do not need regular pap smears.   Please call Dr Denman George at [phone] with questions.

## 2021-01-25 IMAGING — US US PELVIS COMPLETE TRANSABD/TRANSVAG W DUPLEX
2 series · 13 of 25 positions shown · non-contrast
Comparison: CT 10/13/2019

CLINICAL DATA: Left pelvic mass on CT

EXAM:
TRANSABDOMINAL AND TRANSVAGINAL ULTRASOUND OF PELVIS
DOPPLER ULTRASOUND OF OVARIES
TECHNIQUE: Both transabdominal and transvaginal ultrasound examinations of the
pelvis were performed. Transabdominal technique was performed for
global imaging of the pelvis including uterus, ovaries, adnexal
regions, and pelvic cul-de-sac.
It was necessary to proceed with endovaginal exam following the
transabdominal exam to visualize the uterus endometrium ovaries.
Color and duplex Doppler ultrasound was utilized to evaluate blood
flow to the ovaries.

[Series 1: us pelvis complete transabd/transvag w duplex · 12 of 51 slices shown (1 of 2)]
[im 1/51]
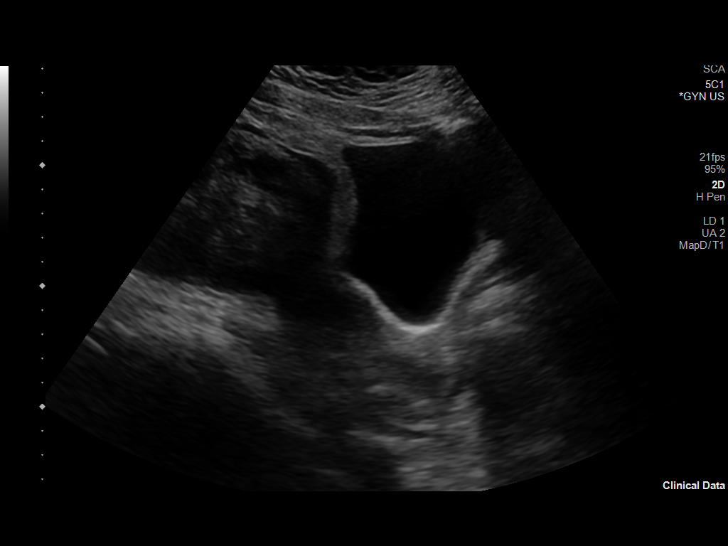
[im 5/51]
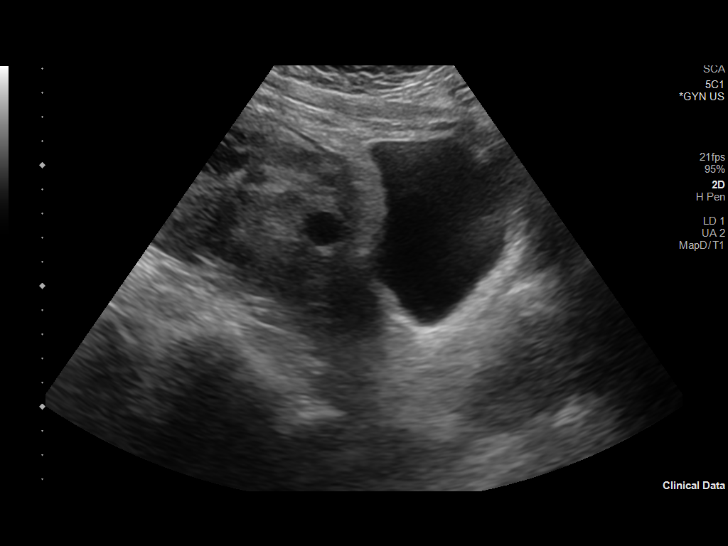
[im 9/51]
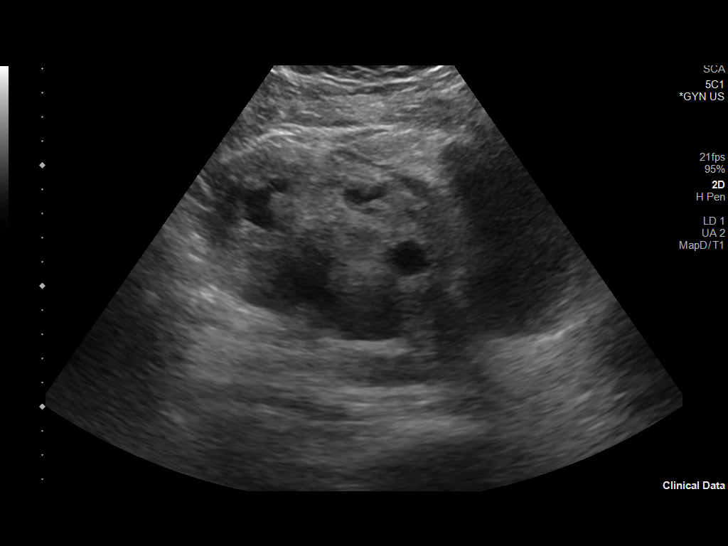
[im 14/51]
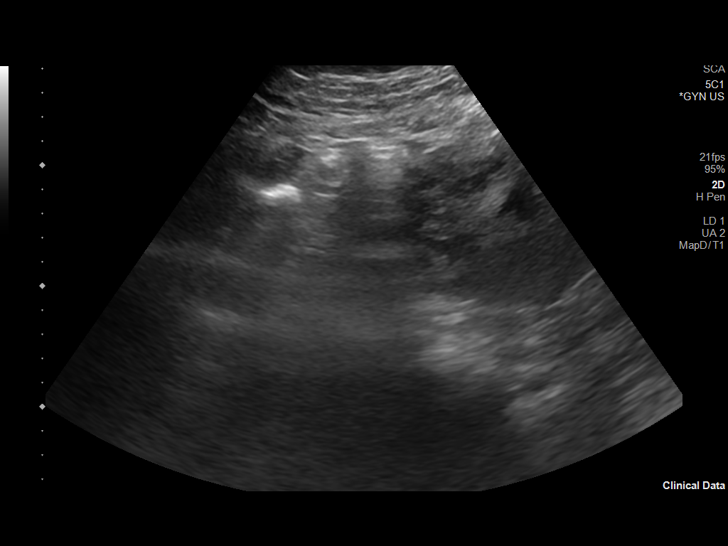
[im 18/51]
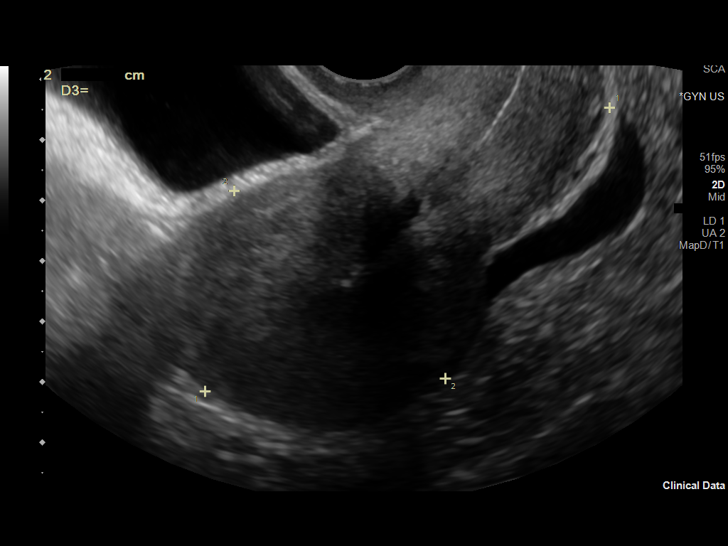
[im 22/51]
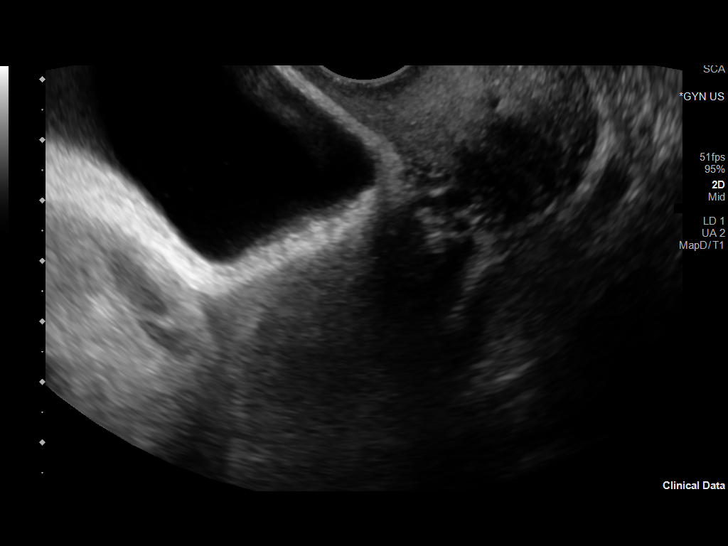
[im 27/51]
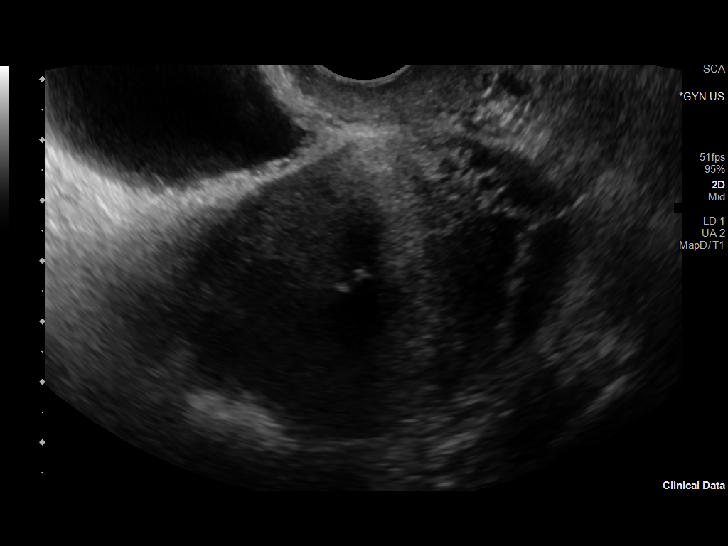
[im 31/51]
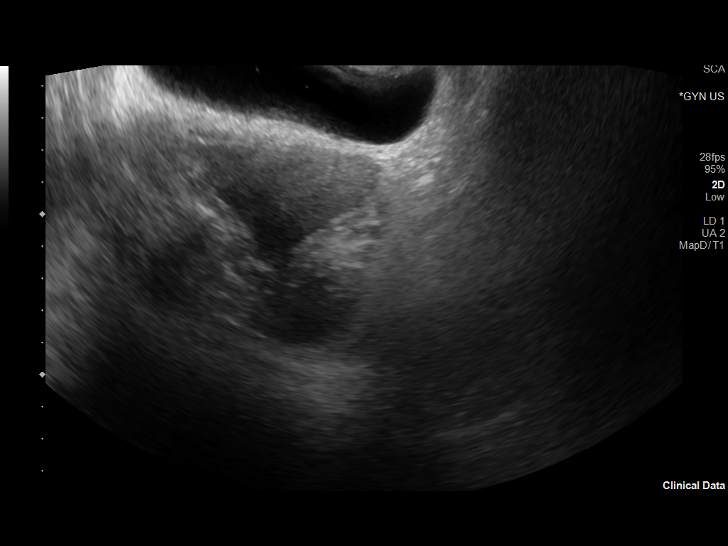
[im 35/51]
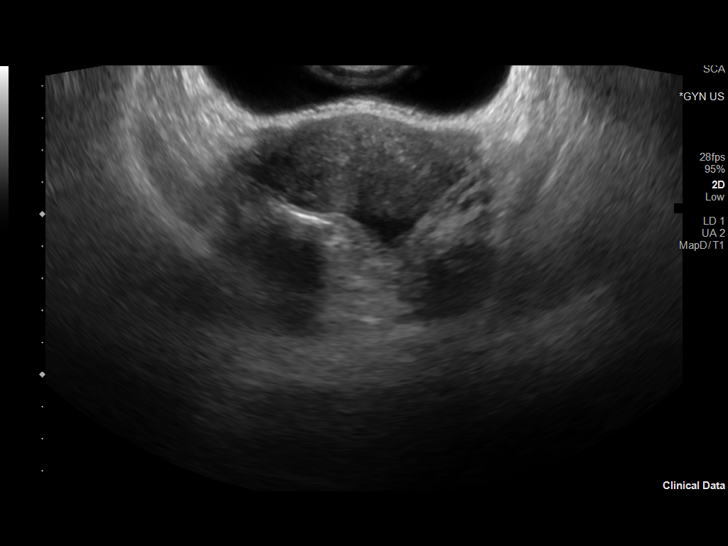
[im 40/51]
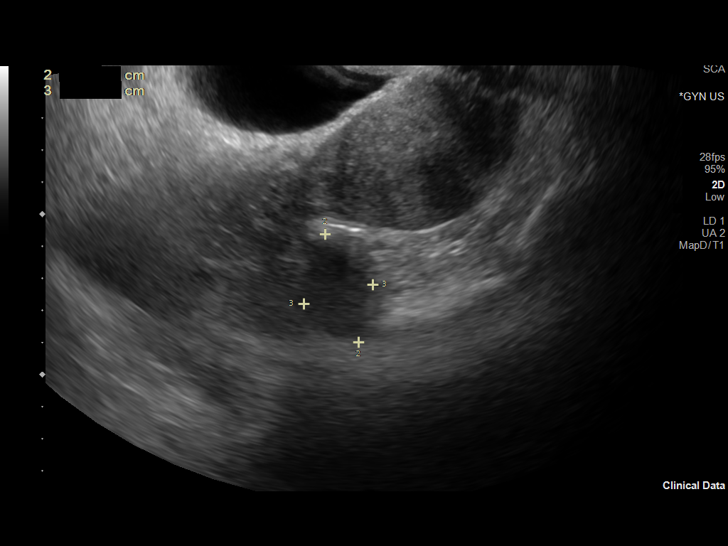
[im 44/51]
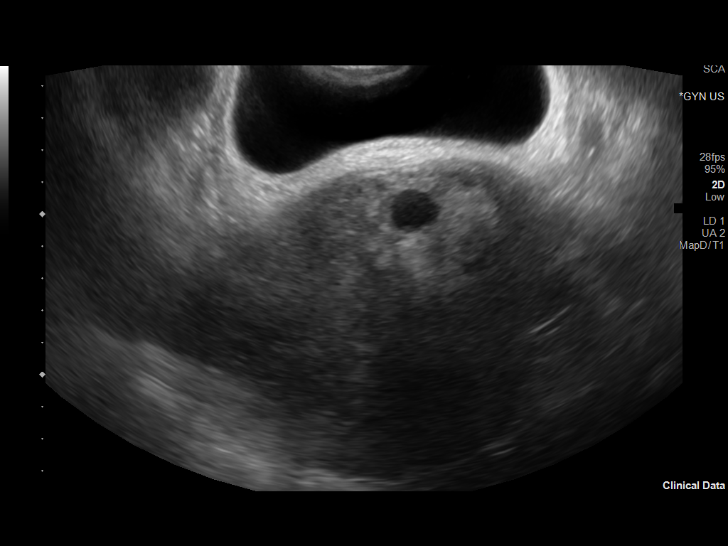
[im 48/51]
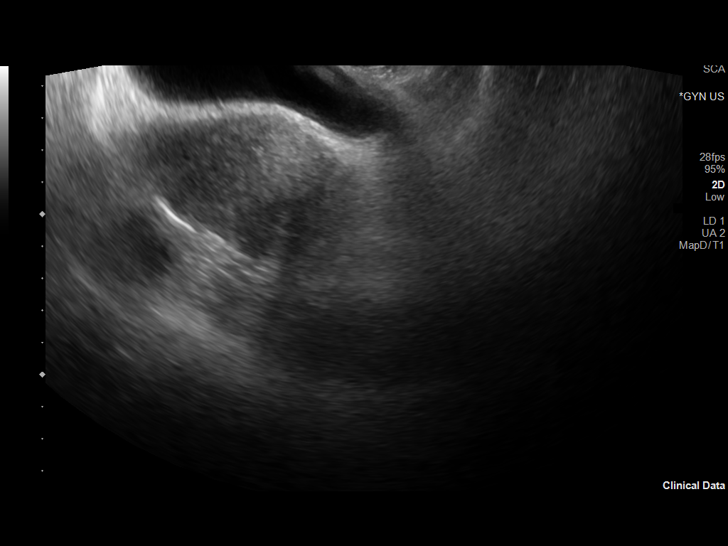

[Series 2: us pelvis complete transabd/transvag w duplex · 1 of 2 slices shown (2 of 2)]
[im 1/2]
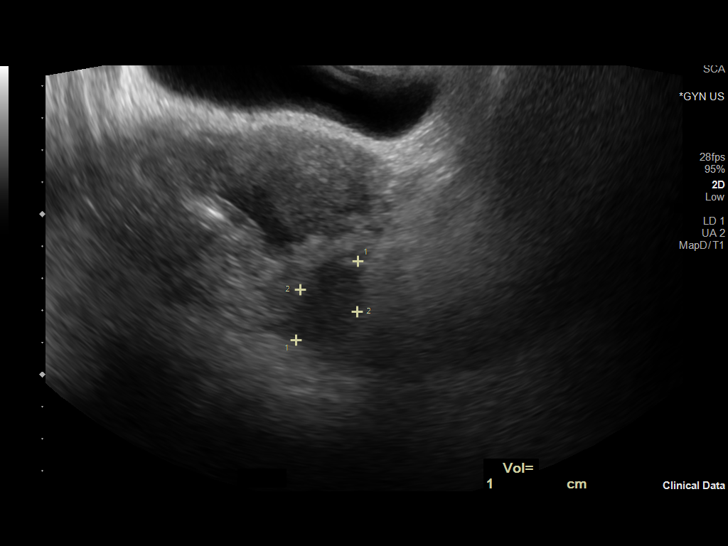

[13 of 25 positions shown; findings below may reference images not displayed]

FINDINGS: Uterus

Measurements: 8.2 x 4.2 x 5.7 cm = volume: 114.1 mL. No fibroids or
other mass visualized.

Endometrium

Thickness: 5 mm.  No focal abnormality visualized.

Right ovary

Measurements: 3.6 x 1.9 x 2.2 cm = volume: 7.7 mL. Midline to
slightly right of midline heterogenous solid appearing mass
measuring 9.8 x 8.4 x 12.9 cm.

Left ovary

Measurements: 3.3 x 1.9 x 2 cm = volume: 6.5 mL. Midline to slightly
right of midline heterogenous solid mass measuring 9.8 x 8.4 x
cm.

Pulsed Doppler evaluation of both ovaries demonstrates normal
low-resistance arterial and venous waveforms on the left. Venous
waveforms identified on the right. Poor arterial waveform on the
right.

Other findings

Small free fluid in the pelvis.
IMPRESSION: 1. Large heterogenous mostly solid-appearing mass within the pelvis
measuring up to 12.9 cm. Mass is midline and cannot be localized to
either ovary with confidence due to large size. Both ovaries are
seen as discrete structures with flow present on the left. Right
arterial flow is poorly documented likely due to limited visibility
of right ovary from habitus and large mass though torsion cannot be
confidently excluded. Gynecologic consultation is recommended.
2. Small free fluid in the pelvis

## 2021-01-25 IMAGING — CT CT ABD-PELV W/ CM
2 of 5 series · 15 of 46 positions shown, 17 images · IV contrast (Omnipaque)
Comparison: No priors.

CLINICAL DATA: 49-year-old female with history of right lower
quadrant abdominal pain. Suspected acute appendicitis.

EXAM:
CT ABDOMEN AND PELVIS WITH CONTRAST
TECHNIQUE: Multidetector CT imaging of the abdomen and pelvis was performed
using the standard protocol following bolus administration of
intravenous contrast.
CONTRAST:  100mL OMNIPAQUE IOHEXOL 300 MG/ML  SOLN

[Series 2: axial st · axial · 0.82mm/px · z∈[-632,-207]mm · 12 of 95 slices shown, 14 images]
[im 5/95  soft-tissue]
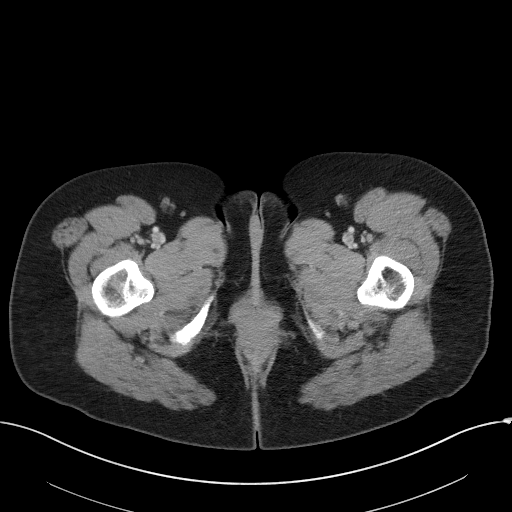
[im 5/95  bone]
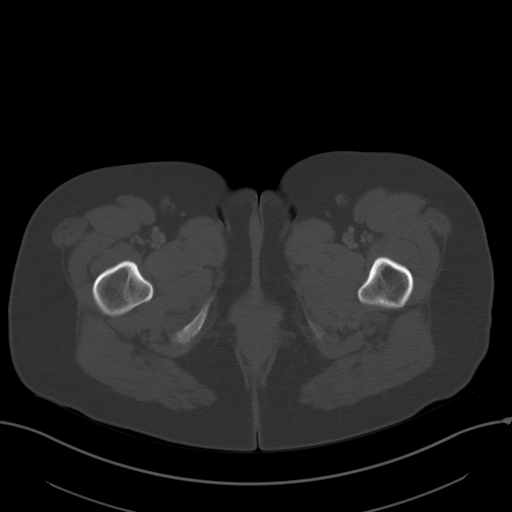
[im 15/95  soft-tissue]
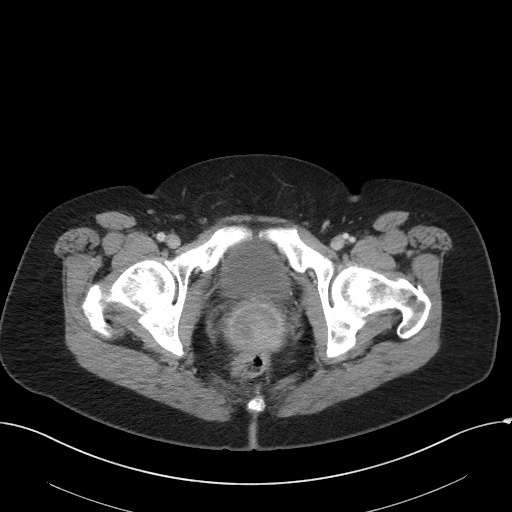
[im 20/95  soft-tissue]
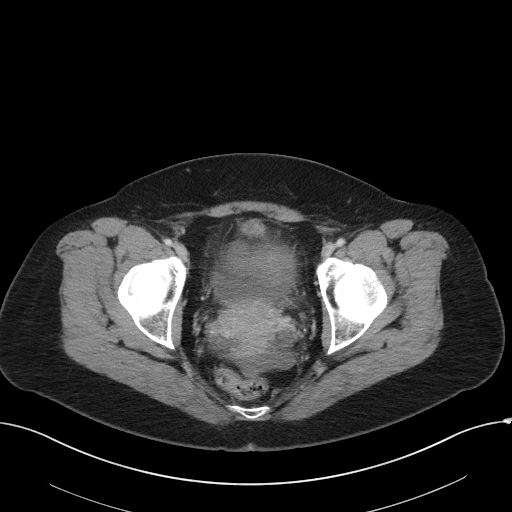
[im 30/95  soft-tissue]
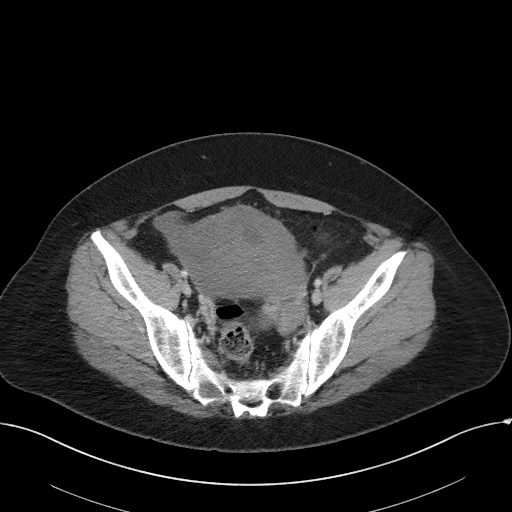
[im 35/95  soft-tissue]
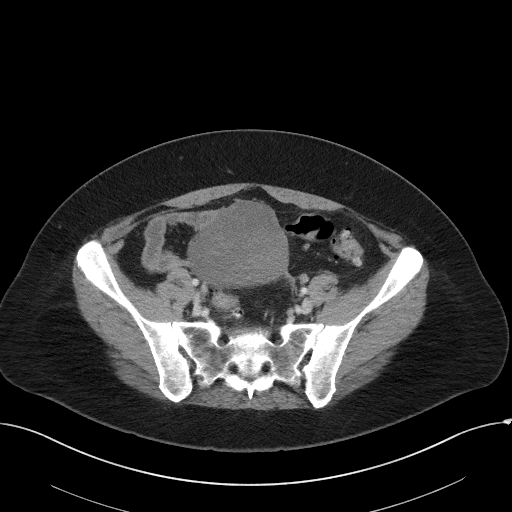
[im 45/95  soft-tissue]
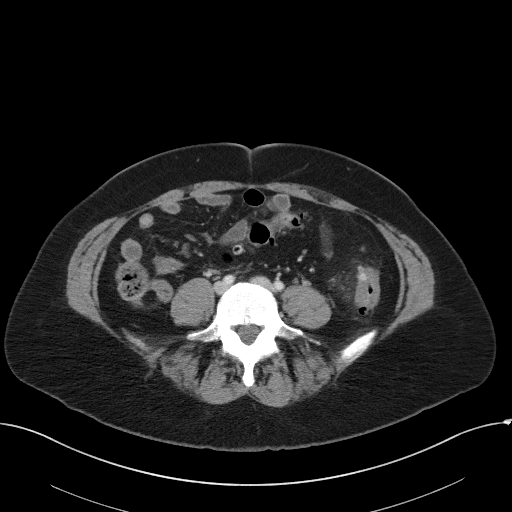
[im 50/95  soft-tissue]
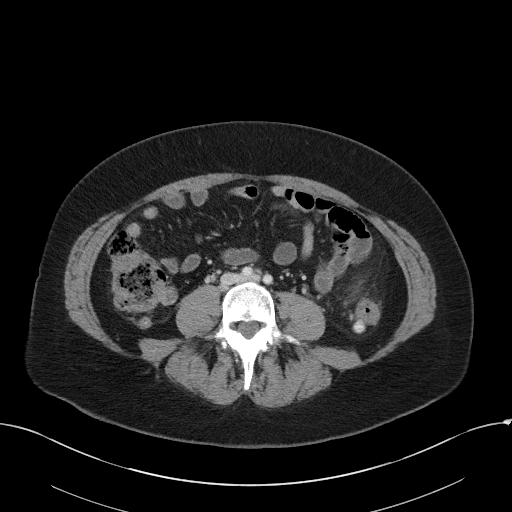
[im 60/95  soft-tissue]
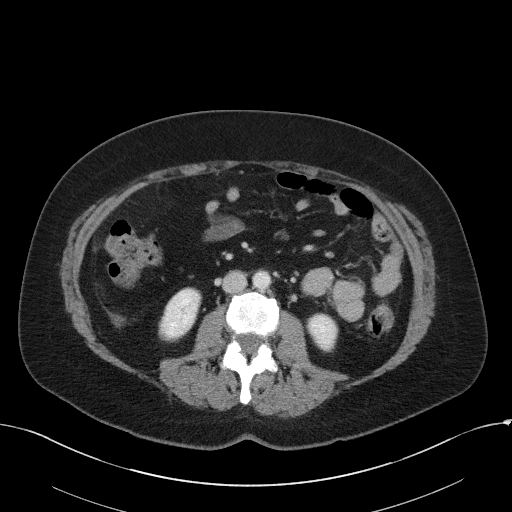
[im 65/95  soft-tissue]
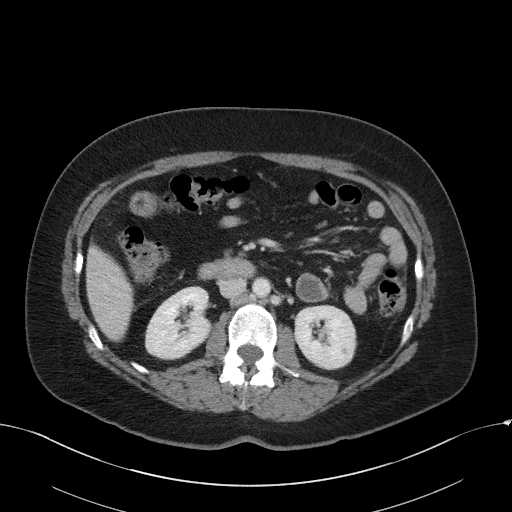
[im 65/95  bone]
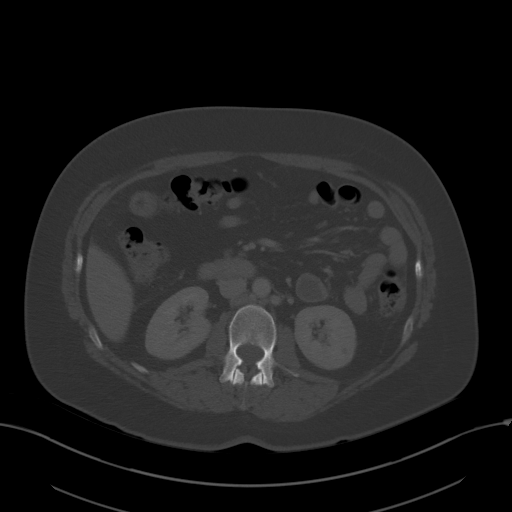
[im 75/95  soft-tissue]
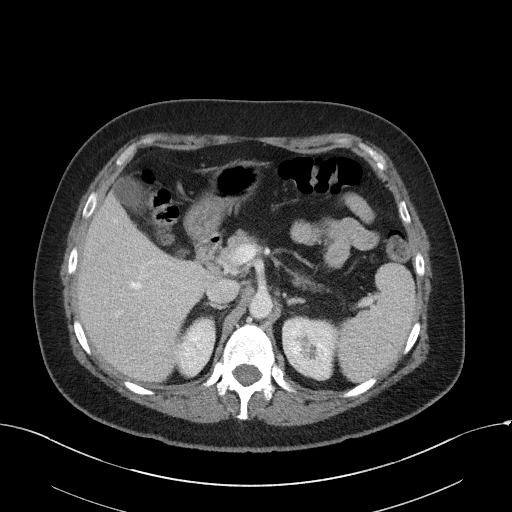
[im 80/95  soft-tissue]
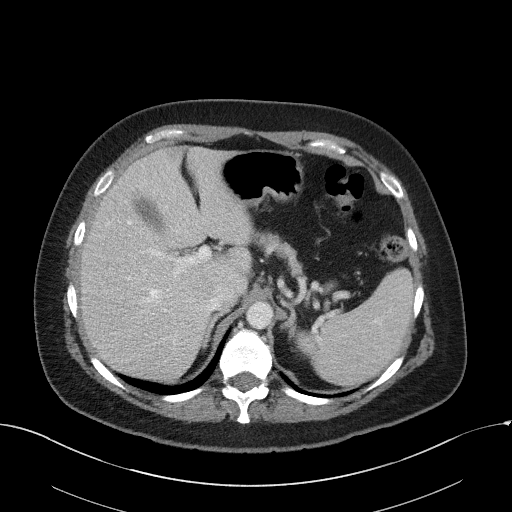
[im 90/95  soft-tissue]
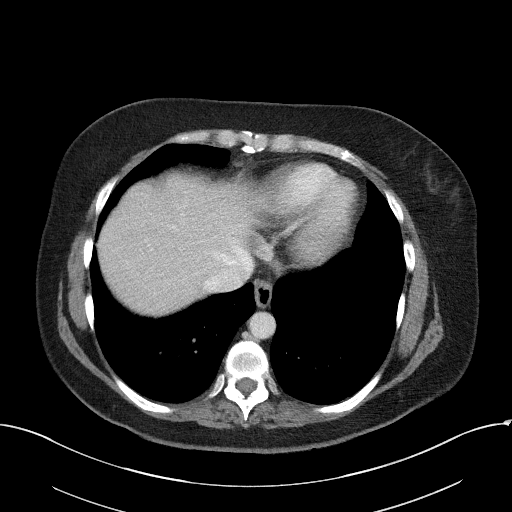

[Series 5: coronal st · coronal · 0.79mm/px · 3 of 102 slices shown]
[im 34/102  soft-tissue]
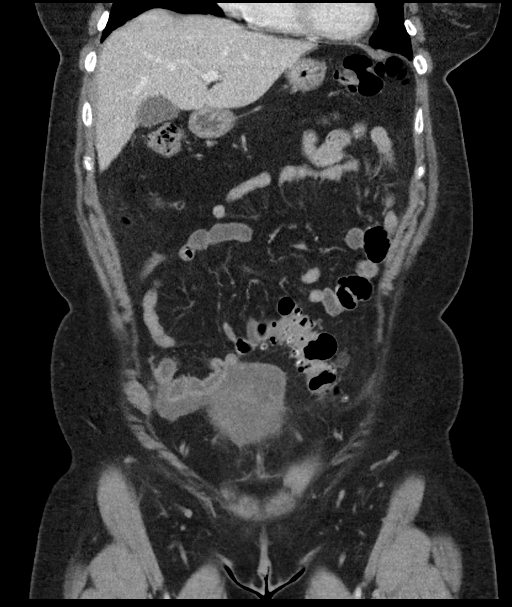
[im 45/102  soft-tissue]
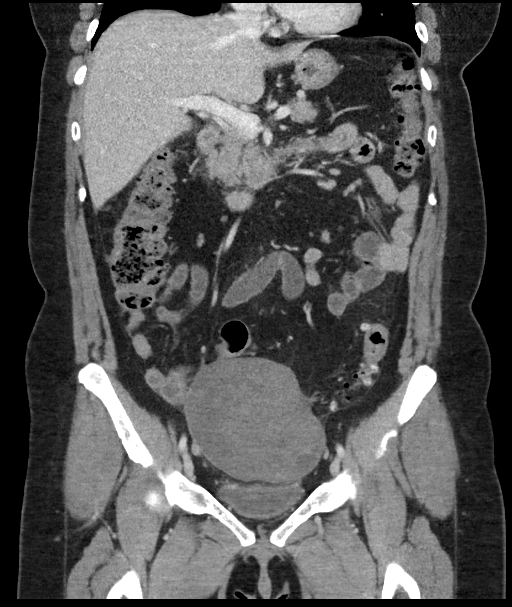
[im 57/102  soft-tissue]
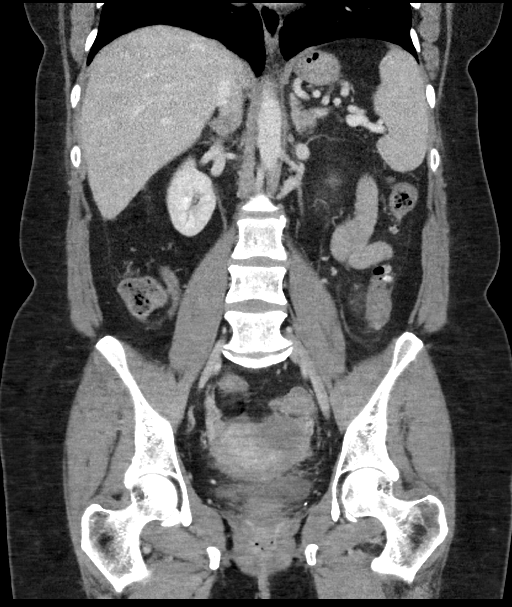

[15 of 46 positions shown; findings below may reference images not displayed]

FINDINGS: Lower chest: Unremarkable.

Hepatobiliary: Subcentimeter low-attenuation lesion in segment 3 of
the liver (axial image 14 of series 2), too small to characterize,
but statistically likely to represent a tiny cyst. No other
suspicious cystic or solid hepatic lesions. No intra or extrahepatic
biliary ductal dilatation. Gallbladder is normal in appearance.

Pancreas: No pancreatic mass. No pancreatic ductal dilatation. No
pancreatic or peripancreatic fluid collections or inflammatory
changes.

Spleen: Unremarkable.

Adrenals/Urinary Tract: Bilateral kidneys and bilateral adrenal
glands are normal in appearance. No hydroureteronephrosis. Urinary
bladder is normal in appearance.

Stomach/Bowel: Normal appearance of the stomach. No pathologic
dilatation of small bowel or colon. Numerous colonic diverticulae
are noted, particularly in the descending colon and sigmoid colon.
Inflammatory changes are noted adjacent to the distal descending
colon (axial image 49 of series 2), compatible with an acute
diverticulitis. No diverticular abscess or signs of frank
perforation are noted at this time. Normal appendix.

Vascular/Lymphatic: No atherosclerotic calcifications in the
abdominal aorta or pelvic vasculature. No lymphadenopathy noted in
the abdomen or pelvis.

Reproductive: Large heterogeneously enhancing cystic mass in the
central pelvis which appears to emanate from the left adnexa,
presumably a large ovarian mass, measuring 11.2 x 7.7 x 9.8 cm
(axial image 68 of series 2 and coronal image 45 of series 5),
highly concerning for primary ovarian neoplasm. Uterus and right
ovary are unremarkable in appearance.

Other: Small volume of ascites.  No pneumoperitoneum.

Musculoskeletal: There are no aggressive appearing lytic or blastic
lesions noted in the visualized portions of the skeleton.
IMPRESSION: 1. Acute diverticulitis of the distal descending colon. No
diverticular abscess or signs of frank perforation are noted at this
time.
2. In addition, however, there is a large heterogeneously enhancing
cystic mass in the pelvis which appears to emanate from the left
ovary, highly concerning for primary ovarian neoplasm. Further
evaluation with pelvic ultrasound is strongly recommended at this
time to better evaluate this finding. Additionally, appropriate
follow-up with OB-GYN is strongly recommended in the near future.
3. Subcentimeter low-attenuation lesion in segment 3 of the liver,
too small to characterize, but statistically likely to represent a
cyst. Close attention on follow-up studies is recommended to ensure
the stability of this finding given the large pelvic mass.
